# Patient Record
Sex: Female | Born: 1970 | Race: White | Hispanic: Yes | Marital: Married | Smoking: Never smoker
Health system: Southern US, Community
[De-identification: ages and names within clinical notes are randomized; demographics above are authoritative.]

## PROBLEM LIST (undated history)

## (undated) DIAGNOSIS — Z9889 Other specified postprocedural states: Secondary | ICD-10-CM

## (undated) DIAGNOSIS — N393 Stress incontinence (female) (male): Secondary | ICD-10-CM

## (undated) DIAGNOSIS — N979 Female infertility, unspecified: Secondary | ICD-10-CM

## (undated) DIAGNOSIS — Z8349 Family history of other endocrine, nutritional and metabolic diseases: Secondary | ICD-10-CM

## (undated) DIAGNOSIS — D649 Anemia, unspecified: Secondary | ICD-10-CM

## (undated) DIAGNOSIS — Z87440 Personal history of urinary (tract) infections: Secondary | ICD-10-CM

## (undated) DIAGNOSIS — B379 Candidiasis, unspecified: Secondary | ICD-10-CM

## (undated) DIAGNOSIS — R112 Nausea with vomiting, unspecified: Secondary | ICD-10-CM

## (undated) DIAGNOSIS — R102 Pelvic and perineal pain: Secondary | ICD-10-CM

## (undated) DIAGNOSIS — D219 Benign neoplasm of connective and other soft tissue, unspecified: Secondary | ICD-10-CM

## (undated) DIAGNOSIS — IMO0002 Reserved for concepts with insufficient information to code with codable children: Secondary | ICD-10-CM

## (undated) DIAGNOSIS — E119 Type 2 diabetes mellitus without complications: Secondary | ICD-10-CM

## (undated) DIAGNOSIS — N644 Mastodynia: Secondary | ICD-10-CM

## (undated) DIAGNOSIS — N938 Other specified abnormal uterine and vaginal bleeding: Secondary | ICD-10-CM

## (undated) DIAGNOSIS — E785 Hyperlipidemia, unspecified: Secondary | ICD-10-CM

## (undated) DIAGNOSIS — N809 Endometriosis, unspecified: Secondary | ICD-10-CM

## (undated) DIAGNOSIS — Z8742 Personal history of other diseases of the female genital tract: Secondary | ICD-10-CM

## (undated) HISTORY — DX: Personal history of urinary (tract) infections: Z87.440

## (undated) HISTORY — DX: Pelvic and perineal pain: R10.2

## (undated) HISTORY — DX: Anemia, unspecified: D64.9

## (undated) HISTORY — DX: Family history of other endocrine, nutritional and metabolic diseases: Z83.49

## (undated) HISTORY — DX: Reserved for concepts with insufficient information to code with codable children: IMO0002

## (undated) HISTORY — DX: Other specified postprocedural states: Z98.890

## (undated) HISTORY — DX: Type 2 diabetes mellitus without complications: E11.9

## (undated) HISTORY — PX: ROBOTIC ASSISTED LAP VAGINAL HYSTERECTOMY: SHX2362

## (undated) HISTORY — PX: WISDOM TOOTH EXTRACTION: SHX21

## (undated) HISTORY — DX: Female infertility, unspecified: N97.9

## (undated) HISTORY — DX: Other specified abnormal uterine and vaginal bleeding: N93.8

## (undated) HISTORY — DX: Mastodynia: N64.4

## (undated) HISTORY — DX: Nausea with vomiting, unspecified: R11.2

## (undated) HISTORY — DX: Personal history of other diseases of the female genital tract: Z87.42

## (undated) HISTORY — DX: Stress incontinence (female) (male): N39.3

## (undated) HISTORY — PX: DILATION AND CURETTAGE OF UTERUS: SHX78

## (undated) HISTORY — DX: Hyperlipidemia, unspecified: E78.5

## (undated) HISTORY — DX: Candidiasis, unspecified: B37.9

## (undated) HISTORY — DX: Benign neoplasm of connective and other soft tissue, unspecified: D21.9

## (undated) HISTORY — DX: Endometriosis, unspecified: N80.9

---

## 1993-07-01 HISTORY — PX: CYSTECTOMY: SUR359

## 1997-07-01 DIAGNOSIS — R87619 Unspecified abnormal cytological findings in specimens from cervix uteri: Secondary | ICD-10-CM

## 1997-07-01 DIAGNOSIS — IMO0002 Reserved for concepts with insufficient information to code with codable children: Secondary | ICD-10-CM

## 1997-07-01 HISTORY — DX: Unspecified abnormal cytological findings in specimens from cervix uteri: R87.619

## 1997-07-01 HISTORY — PX: LEEP: SHX91

## 1997-07-01 HISTORY — DX: Reserved for concepts with insufficient information to code with codable children: IMO0002

## 1998-01-18 ENCOUNTER — Encounter: Admission: RE | Admit: 1998-01-18 | Discharge: 1998-04-18 | Payer: Self-pay | Admitting: Gynecology

## 1998-03-17 ENCOUNTER — Ambulatory Visit (HOSPITAL_COMMUNITY): Admission: RE | Admit: 1998-03-17 | Discharge: 1998-03-17 | Payer: Self-pay | Admitting: Gynecology

## 1998-07-27 ENCOUNTER — Ambulatory Visit (HOSPITAL_COMMUNITY): Admission: RE | Admit: 1998-07-27 | Discharge: 1998-07-27 | Payer: Self-pay | Admitting: Gynecology

## 1999-05-07 ENCOUNTER — Encounter (INDEPENDENT_AMBULATORY_CARE_PROVIDER_SITE_OTHER): Payer: Self-pay | Admitting: Specialist

## 1999-05-07 ENCOUNTER — Other Ambulatory Visit: Admission: RE | Admit: 1999-05-07 | Discharge: 1999-05-07 | Payer: Self-pay | Admitting: Gynecology

## 1999-07-02 DIAGNOSIS — Z8742 Personal history of other diseases of the female genital tract: Secondary | ICD-10-CM

## 1999-07-02 HISTORY — DX: Personal history of other diseases of the female genital tract: Z87.42

## 2000-02-18 ENCOUNTER — Other Ambulatory Visit: Admission: RE | Admit: 2000-02-18 | Discharge: 2000-02-18 | Payer: Self-pay | Admitting: *Deleted

## 2000-05-10 ENCOUNTER — Emergency Department (HOSPITAL_COMMUNITY): Admission: EM | Admit: 2000-05-10 | Discharge: 2000-05-10 | Payer: Self-pay | Admitting: Emergency Medicine

## 2000-07-01 DIAGNOSIS — Z8742 Personal history of other diseases of the female genital tract: Secondary | ICD-10-CM

## 2000-07-01 HISTORY — DX: Personal history of other diseases of the female genital tract: Z87.42

## 2001-02-26 ENCOUNTER — Other Ambulatory Visit: Admission: RE | Admit: 2001-02-26 | Discharge: 2001-02-26 | Payer: Self-pay | Admitting: Gynecology

## 2001-07-01 DIAGNOSIS — R102 Pelvic and perineal pain: Secondary | ICD-10-CM

## 2001-07-01 DIAGNOSIS — Z8742 Personal history of other diseases of the female genital tract: Secondary | ICD-10-CM

## 2001-07-01 HISTORY — DX: Pelvic and perineal pain: R10.2

## 2001-07-01 HISTORY — DX: Personal history of other diseases of the female genital tract: Z87.42

## 2001-07-01 HISTORY — PX: HYSTEROSCOPY WITH D & C: SHX1775

## 2001-07-16 ENCOUNTER — Ambulatory Visit (HOSPITAL_COMMUNITY): Admission: RE | Admit: 2001-07-16 | Discharge: 2001-07-16 | Payer: Self-pay | Admitting: Obstetrics and Gynecology

## 2001-07-16 ENCOUNTER — Encounter: Payer: Self-pay | Admitting: Obstetrics and Gynecology

## 2001-12-14 ENCOUNTER — Other Ambulatory Visit: Admission: RE | Admit: 2001-12-14 | Discharge: 2001-12-14 | Payer: Self-pay | Admitting: Obstetrics and Gynecology

## 2001-12-22 ENCOUNTER — Encounter (INDEPENDENT_AMBULATORY_CARE_PROVIDER_SITE_OTHER): Payer: Self-pay | Admitting: Specialist

## 2001-12-22 ENCOUNTER — Ambulatory Visit (HOSPITAL_COMMUNITY): Admission: RE | Admit: 2001-12-22 | Discharge: 2001-12-22 | Payer: Self-pay | Admitting: Obstetrics and Gynecology

## 2002-07-01 DIAGNOSIS — Z8742 Personal history of other diseases of the female genital tract: Secondary | ICD-10-CM

## 2002-07-01 HISTORY — DX: Personal history of other diseases of the female genital tract: Z87.42

## 2003-06-21 ENCOUNTER — Other Ambulatory Visit: Admission: RE | Admit: 2003-06-21 | Discharge: 2003-06-21 | Payer: Self-pay | Admitting: Obstetrics and Gynecology

## 2004-06-08 ENCOUNTER — Observation Stay (HOSPITAL_COMMUNITY): Admission: AD | Admit: 2004-06-08 | Discharge: 2004-06-09 | Payer: Self-pay | Admitting: Obstetrics and Gynecology

## 2004-06-14 ENCOUNTER — Ambulatory Visit (HOSPITAL_COMMUNITY): Admission: RE | Admit: 2004-06-14 | Discharge: 2004-06-14 | Payer: Self-pay | Admitting: Obstetrics and Gynecology

## 2004-06-18 ENCOUNTER — Ambulatory Visit (HOSPITAL_COMMUNITY): Admission: RE | Admit: 2004-06-18 | Discharge: 2004-06-18 | Payer: Self-pay | Admitting: Obstetrics and Gynecology

## 2004-06-21 ENCOUNTER — Ambulatory Visit (HOSPITAL_COMMUNITY): Admission: RE | Admit: 2004-06-21 | Discharge: 2004-06-21 | Payer: Self-pay | Admitting: Obstetrics and Gynecology

## 2004-06-21 ENCOUNTER — Ambulatory Visit: Payer: Self-pay | Admitting: *Deleted

## 2004-09-19 ENCOUNTER — Encounter (INDEPENDENT_AMBULATORY_CARE_PROVIDER_SITE_OTHER): Payer: Self-pay | Admitting: *Deleted

## 2004-09-19 ENCOUNTER — Inpatient Hospital Stay (HOSPITAL_COMMUNITY): Admission: AD | Admit: 2004-09-19 | Discharge: 2004-09-22 | Payer: Self-pay | Admitting: Obstetrics and Gynecology

## 2005-02-19 ENCOUNTER — Other Ambulatory Visit: Admission: RE | Admit: 2005-02-19 | Discharge: 2005-02-19 | Payer: Self-pay | Admitting: Obstetrics and Gynecology

## 2005-07-01 DIAGNOSIS — N644 Mastodynia: Secondary | ICD-10-CM

## 2005-07-01 HISTORY — PX: NOVASURE ABLATION: SHX5394

## 2005-07-01 HISTORY — DX: Mastodynia: N64.4

## 2005-07-19 ENCOUNTER — Ambulatory Visit (HOSPITAL_COMMUNITY): Admission: RE | Admit: 2005-07-19 | Discharge: 2005-07-19 | Payer: Self-pay | Admitting: Obstetrics and Gynecology

## 2005-07-19 ENCOUNTER — Encounter (INDEPENDENT_AMBULATORY_CARE_PROVIDER_SITE_OTHER): Payer: Self-pay | Admitting: Specialist

## 2005-07-22 ENCOUNTER — Inpatient Hospital Stay (HOSPITAL_COMMUNITY): Admission: AD | Admit: 2005-07-22 | Discharge: 2005-07-25 | Payer: Self-pay | Admitting: Obstetrics and Gynecology

## 2006-03-25 ENCOUNTER — Other Ambulatory Visit: Admission: RE | Admit: 2006-03-25 | Discharge: 2006-03-25 | Payer: Self-pay | Admitting: Obstetrics and Gynecology

## 2006-06-26 IMAGING — CT CT ABDOMEN W/ CM
1 of 3 series · 13 of 32 positions shown, 18 images · IV contrast (omnipaque)
Comparison: none

CLINICAL DATA: Status post uterine ablation on 07/19/05 with abdominal pain, nausea, vomiting and elevated white count. History of fibroids and myomectomy in 7888.
ABDOMEN CT WITH CONTRAST:
TECHNIQUE: Multidetector CT imaging of the abdomen was performed following the standard protocol during bolus administration of intravenous contrast.
Contrast:  100 cc Omnipaque 300 and oral contrast.
TECHNIQUE: Multidetector CT imaging of the pelvis was performed following the standard protocol during bolus administration of intravenous contrast.

[Series 2: abd pelvis · axial · 0.69mm/px · z∈[-468,-53]mm · 13 of 94 slices shown, 18 images]
[im 6/94  soft-tissue]
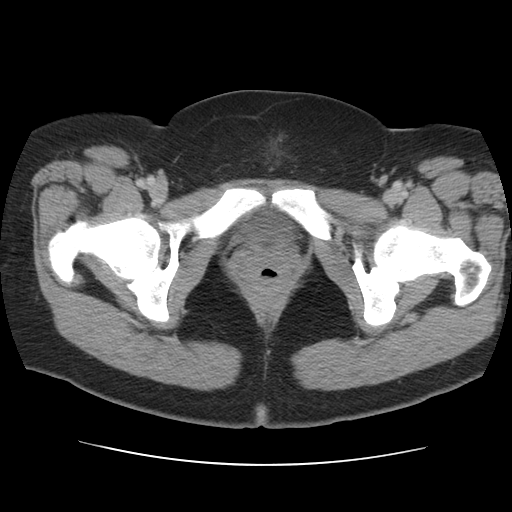
[im 6/94  bone]
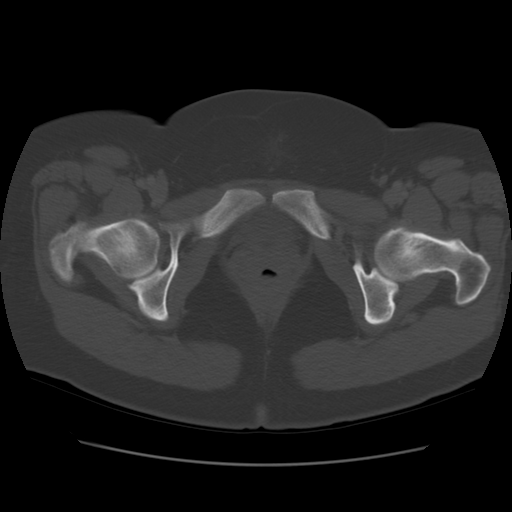
[im 17/94  soft-tissue]
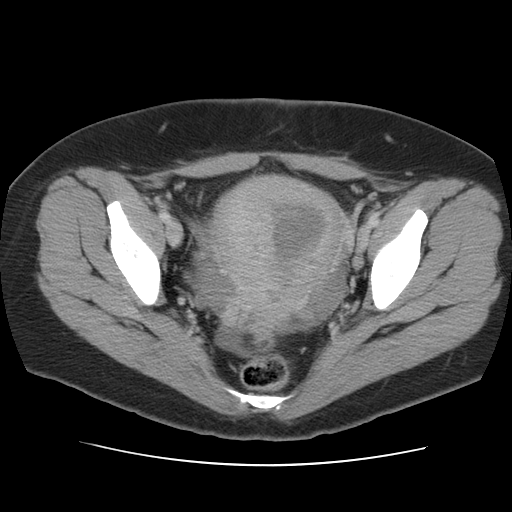
[im 22/94  soft-tissue]
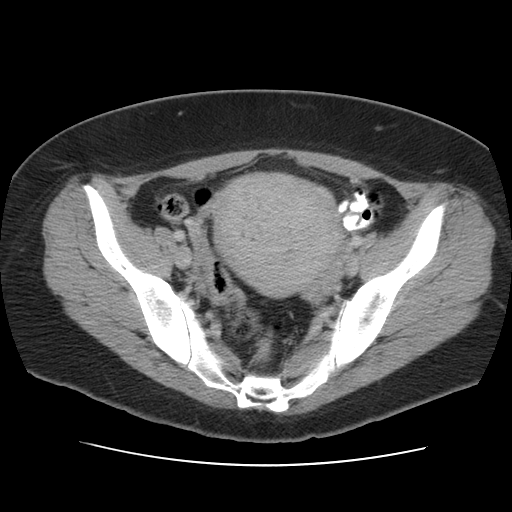
[im 28/94  soft-tissue]
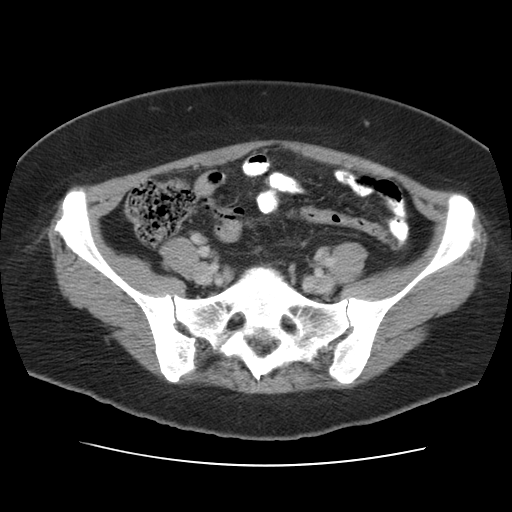
[im 39/94  soft-tissue]
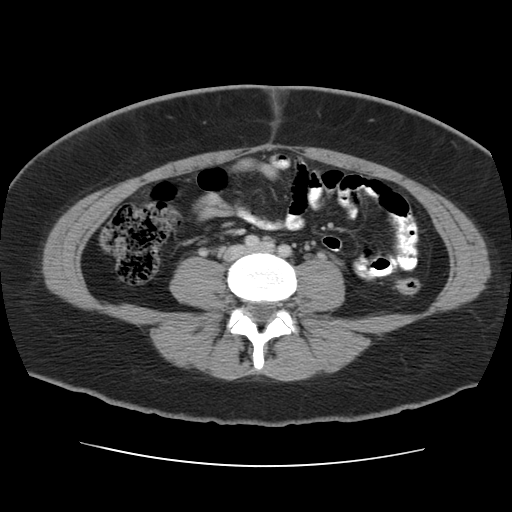
[im 44/94  soft-tissue]
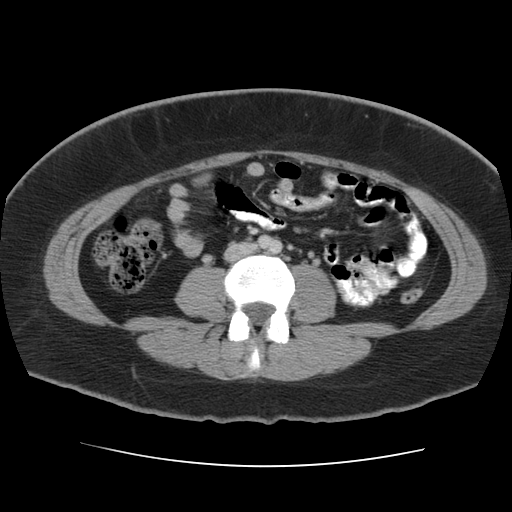
[im 50/94  soft-tissue]
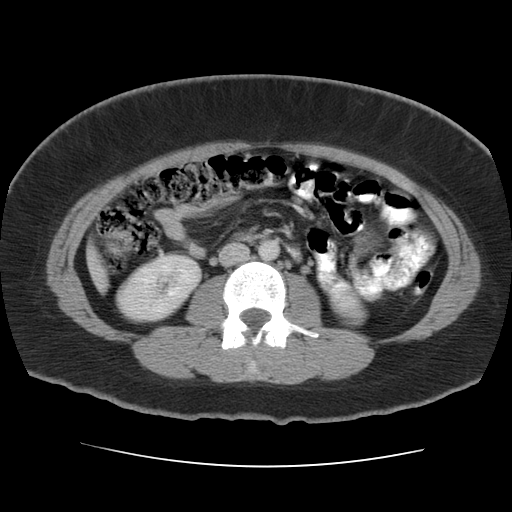
[im 61/94  soft-tissue]
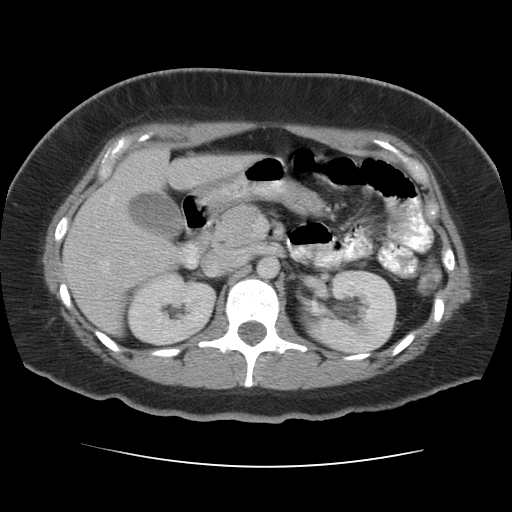
[im 66/94  soft-tissue]
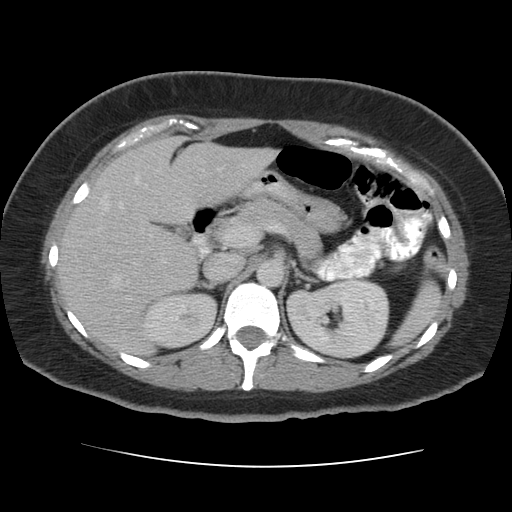
[im 66/94  bone]
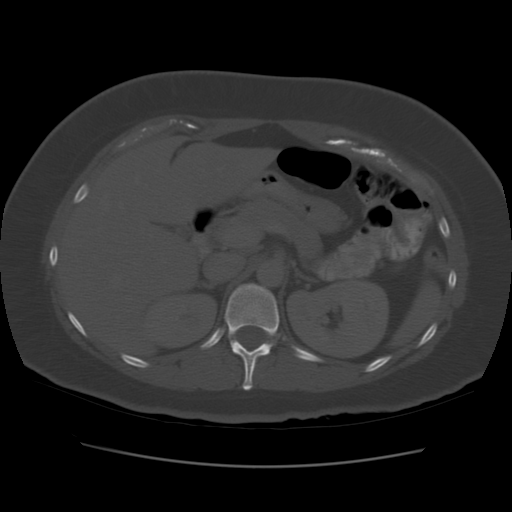
[im 72/94  soft-tissue]
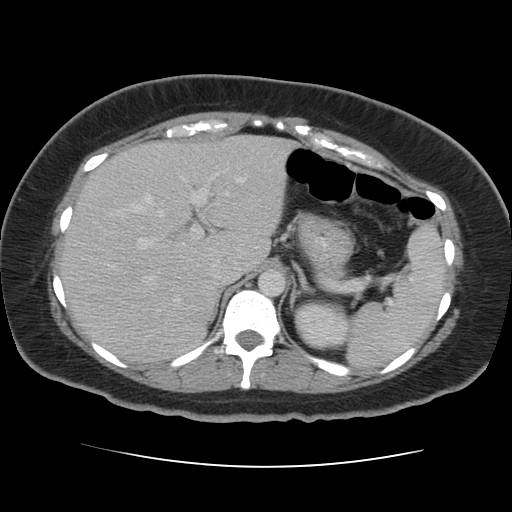
[im 72/94  lung]
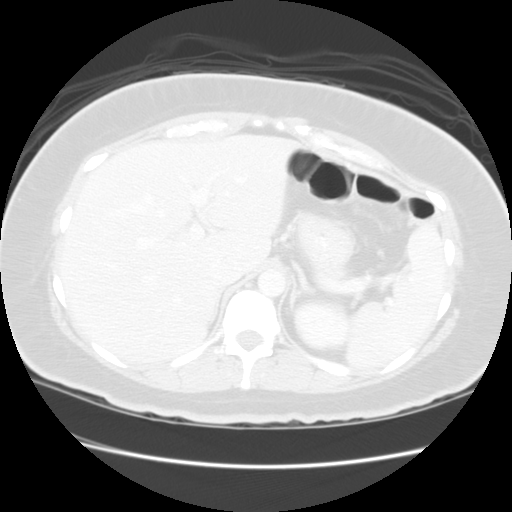
[im 77/94  lung]
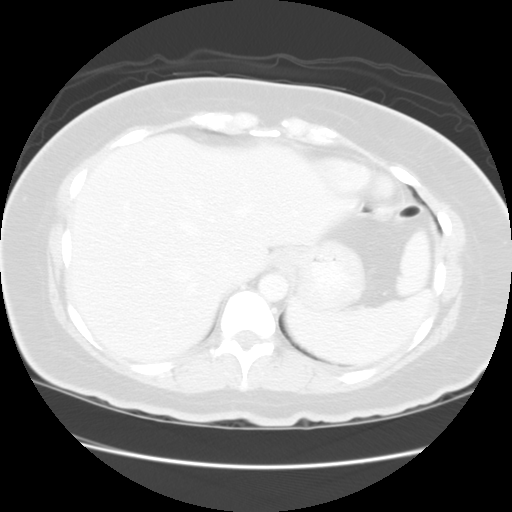
[im 83/94  soft-tissue]
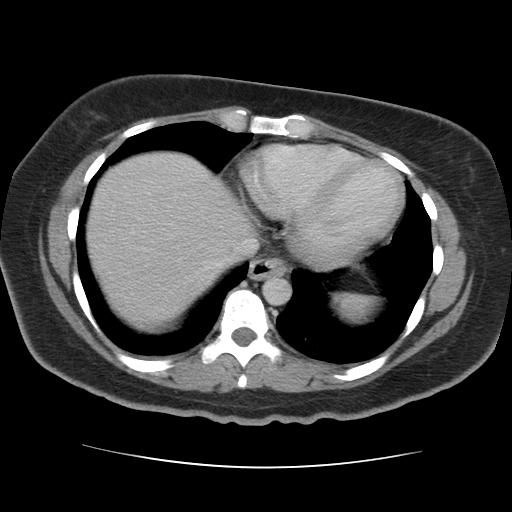
[im 83/94  lung]
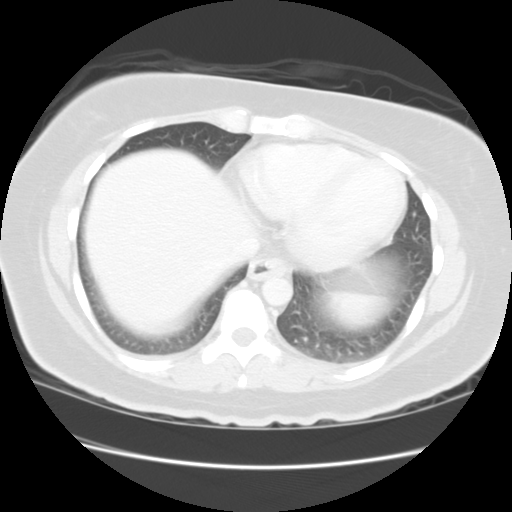
[im 88/94  soft-tissue]
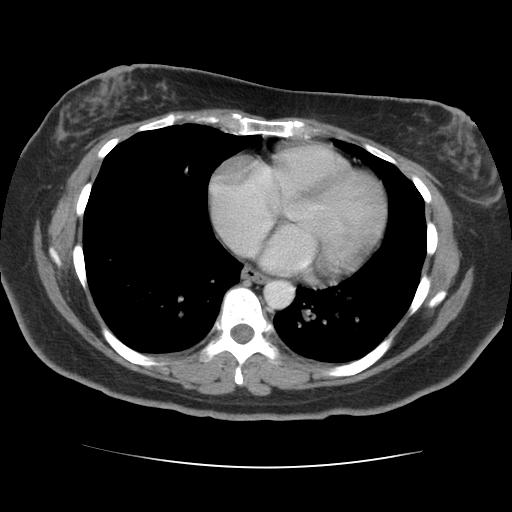
[im 88/94  lung]
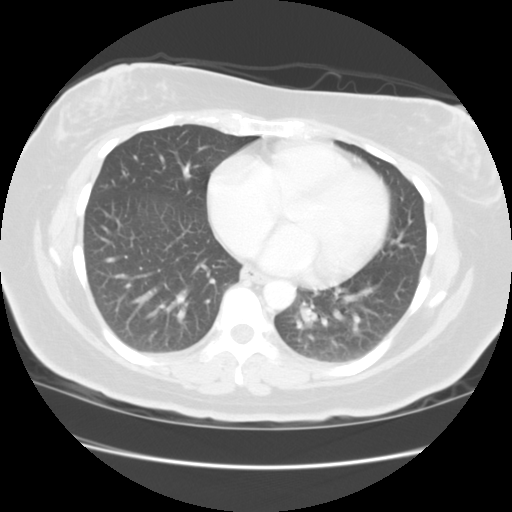

[13 of 32 positions shown; findings below may reference images not displayed]

FINDINGS: Spiral scanning from the dome of the diaphragm to the pubic symphysis was obtained.  Both soft tissue and lung windows were obtained as well as delayed images through the kidneys and bladder.
FINDINGS: The lung bases appear clear. The liver, spleen, adrenal glands, kidneys, pancreas, gallbladder, and proximal bowel all have a normal post contrast appearance.  Incidentally noted is a small splenule in the region of the splenic hilum.  No intraabdominal fluid is seen.  One small periaortic lymph node is seen and this measures less than 1 cm which is within acceptable limits for size. No signs of free intraperitoneal air noted.  Delayed images through the kidneys reveal symmetric excretion pattern. Noted is a small subcentimeter cyst in the mid pole of the right kidney.
IMPRESSION: Unremarkable abdominal CT.
PELVIS CT WITH CONTRAST:
FINDINGS: Good distal small bowel contrast was achieved.  No focal bowel abnormality is identified.  No abnormal intraabdominal collection is seen.  A small amount of fluid is noted in the cul-de-sac.  There is some heterogeneous enhancement and fluid identified within the endometrial portion of the uterus which is likely post procedural in nature given the patient?s recent history of ablation.  No signs of inflammatory change in the periuterine fat are seen and aside from a small amount of free fluid in the cul-de-sac, no periuterine fluid is noted.  A normal appearing appendix is seen. No abnormal adenopathy is seen within the pelvis.
Delayed images through the bladder demonstrate normal distal ureters and a normal bladder morphology.
IMPRESSION: 1.  Some mottling of the enhancement of the myometrium adjacent to the endometrium, as well as fluid within the endometrial canal is seen and is felt likely to be post procedural in nature.
2.  Small amount of cul-de-sac fluid.
3.  No signs of inflammatory change within the pelvis.  No abnormal focal collections and no definite bowel abnormality noted.

## 2008-01-13 ENCOUNTER — Encounter: Admission: RE | Admit: 2008-01-13 | Discharge: 2008-01-13 | Payer: Self-pay | Admitting: Obstetrics and Gynecology

## 2008-10-14 DIAGNOSIS — N938 Other specified abnormal uterine and vaginal bleeding: Secondary | ICD-10-CM

## 2008-10-14 DIAGNOSIS — N8003 Adenomyosis of the uterus: Secondary | ICD-10-CM

## 2008-10-14 DIAGNOSIS — N800 Endometriosis of uterus: Secondary | ICD-10-CM

## 2008-10-14 DIAGNOSIS — D219 Benign neoplasm of connective and other soft tissue, unspecified: Secondary | ICD-10-CM

## 2008-10-14 HISTORY — DX: Endometriosis of uterus: N80.0

## 2008-10-14 HISTORY — DX: Other specified abnormal uterine and vaginal bleeding: N93.8

## 2008-10-14 HISTORY — DX: Benign neoplasm of connective and other soft tissue, unspecified: D21.9

## 2008-10-14 HISTORY — DX: Adenomyosis of the uterus: N80.03

## 2008-10-18 ENCOUNTER — Ambulatory Visit (HOSPITAL_COMMUNITY): Admission: RE | Admit: 2008-10-18 | Discharge: 2008-10-18 | Payer: Self-pay | Admitting: Obstetrics and Gynecology

## 2008-10-18 ENCOUNTER — Encounter (INDEPENDENT_AMBULATORY_CARE_PROVIDER_SITE_OTHER): Payer: Self-pay | Admitting: Obstetrics and Gynecology

## 2010-07-21 ENCOUNTER — Encounter: Payer: Self-pay | Admitting: Obstetrics and Gynecology

## 2010-07-22 ENCOUNTER — Encounter: Payer: Self-pay | Admitting: Obstetrics and Gynecology

## 2010-10-10 LAB — BASIC METABOLIC PANEL
BUN: 6 mg/dL (ref 6–23)
Chloride: 109 mEq/L (ref 96–112)
Creatinine, Ser: 0.63 mg/dL (ref 0.4–1.2)
GFR calc non Af Amer: >60 mL/min (ref >60)
Glucose, Bld: 107 mg/dL — ABNORMAL HIGH (ref 70–99)
Potassium: 4.2 mEq/L (ref 3.5–5.1)

## 2010-10-10 LAB — CBC
HCT: 30.6 % — ABNORMAL LOW (ref 36.0–46.0)
MCV: 77.4 fL — ABNORMAL LOW (ref 78.0–100.0)
Platelets: 504 10*3/uL — ABNORMAL HIGH (ref 150–400)
RDW: 16.7 % — ABNORMAL HIGH (ref 11.5–15.5)

## 2010-10-10 LAB — PREGNANCY, URINE: Preg Test, Ur: NEGATIVE

## 2010-11-13 NOTE — H&P (Signed)
[name], [name]                  ACCOUNT NO.:  [id number]   MEDICAL RECORD NO.:  [id number]          PATIENT TYPE:  AMB   LOCATION:  DAY                          FACILITY:  [facility]   PHYSICIAN:  Crist Fat. Rivard, M.D. DATE OF BIRTH:  1970/09/19   DATE OF ADMISSION:  10/18/2008  DATE OF DISCHARGE:                              HISTORY & PHYSICAL   HISTORY OF PRESENT ILLNESS:  [name] is a 40 year old married Hispanic  female, para 1-0-0-1, who presents for a robotically-assisted  hysterectomy because of dysfunctional uterine bleeding, adenomyosis,  endometriosis, and uterine fibroids.  Ms. Gasparyan has a long history of  heavy irregular vaginal bleeding and was early on diagnosed with  polycystic ovarian syndrome and endometriosis.  In 2003 while being  evaluated for chronic pelvic pain and heavy bleeding, the patient was  diagnosed with a submucosal fibroid which initially was treated with  Lupron 11.25 mg, which provided her symptomatic relief until she  underwent a hysteroscopy with D and C in June of that year.  Following  this procedure the patient had good control of her symptoms using oral  contraceptives for almost a year, however after that time she began to  bleed heavily again accompanied by severe pain.  In 2007 the patient  underwent endometrial ablation for her persistent heavy bleeding, but in  spite of this procedure she resumed bleeding heavily.  The patient was  placed on oral contraception followed by the Implanon implantable  device, however, neither of these efforts curtailed her irregular and  heavy bleeding.  Again, in July 2008 the patient was given Lupron, which  rendered her amenorrheic as well as pain free for almost a year.  She  again approximately 1 year later began her cycle of heavy bleeding and  pelvic pain.  In spite of being given Depo-Provera, her symptoms  persisted, though she did report that her bleeding was lessened  significantly.  An endometrial  biopsy done July 2009 showed no  hyperplasia, atypia or malignancy, and her TSH was normal.  At that same  time the patient underwent an MRI of her pelvis which showed that the  uterus contained findings consistent with adenomyosis.  Since November  2009, the patient reports daily bleeding which has been intermittent  with flow increasing to the point at times when she has to __________  due to clotting every hour.  On other days, however she states she can  go up to 2 hours without having to change her pad.  She reports severe  cramping which she rates as a 10/10 on a 10-point pain scale, but will  have a decrease in her symptoms with Naprosyn (5/10), and almost totally  eradicated using Vicodin (2/10).  Her complete blood count in February  2010 showed hemoglobin of 13.0, hematocrit 39.2.  She was placed on  Provera 10 mg which she takes twice daily and as a result only has to  use 4 panty liners per day.  The patient continues to have cramping,  however it is manageable using the previously mentioned medications.  The patient  admits to nausea and vomiting when she has severe pain and  will leak small amounts of urine if she were to cough, sneeze or laugh.  Otherwise, she denies any dysuria, any urinary frequency, any urgency,  changes in her bowel movements, vaginitis symptoms or fever.  Given the  protracted and disruptive nature of her symptoms, though the patient is  fully aware of both medical and surgical management options, she has  decided to proceed with definitive therapy in the form of hysterectomy.   The patient underwent a pelvic ultrasound on October 13, 2008 which showed  a uterus measuring 10.59 cm x 9.57 cm x 7.37 cm.  She had a posterior  fibroid measuring 3.43 x 2.49 x 3.03 cm and an anterior fibroid  measuring 2.31 x 1.78 x 2.02 cm.  Her left ovary measured 3.49 x 3.52 x  1.96 cm, and her right ovary measures 3.10 x 3.44 x 2.83 cm.  The  remainder of the patient's  study was within normal limits.   PAST MEDICAL HISTORY:   OBSTETRICAL HISTORY:  Gravida 1, para 1-0-0-1.  The patient underwent  cesarean section as a result of in vitro fertilization utilizing donor  sperm.   GYNECOLOGIC HISTORY:  Menarche at 40 years old.  Last menstrual period:  See history of present illness.  The patient does not use any method of  contraception.  She denies any history of sexually transmitted diseases.  She underwent a LEEP procedure in 1999.  Pap smears have been normal  since that time however, with her most recent being November 2009.   MEDICAL HISTORY:  1. Endometriosis.  2. Polycystic ovarian syndrome.  3. Anemia (hemoglobin as low as 7.9).   SURGICAL HISTORY:  1. 1995 laparotomy for a right ovarian cystectomy.  2. Between 1999 and 2001 the patient underwent 3 D and C's for heavy      irregular bleeding.  3. 2000 diagnostic laparoscopy, at which time she was diagnosed with      endometriosis.  She also had lysis of adhesions.  4. 2003 hysteroscopy, D and C, with resection of a submucosal fibroid.  5. 2007 endometrial ablation.  She denies any history of blood      transfusion.  She does admit to severe nausea following anesthesia.   FAMILY HISTORY:  Thyroid disease, diabetes, hypertension.   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  Habits:  She does not use tobacco or illicit drugs, but she rarely  consumes alcohol.   CURRENT MEDICATIONS:  Provera 10 mg 2 tablets daily.  The patient has no  known drug allergies.   REVIEW OF SYSTEMS:  The patient does wear glasses.  She does have stress  urinary incontinence symptoms.  She denies any chest pain, shortness of  breath, headache, vision changes, nausea or vomiting, diarrhea, joint  pain, skin rashes and except as is mentioned in history of present  illness, the patient's review of systems is otherwise negative.   PHYSICAL EXAMINATION:  Blood pressure 120/78, pulse is  82, respirations  16, weight 189, height 5 feet 6-1/2 inches tall.  Body mass index 30.1.  GENERAL EXAM:  Neck is supple without masses.  There is no thyromegaly  or cervical adenopathy.  HEART:  Regular rate and rhythm.  LUNGS:  Clear.  BACK:  No CVA tenderness.  ABDOMEN:  No tenderness, masses, organomegaly.  EXTREMITIES:  No clubbing, cyanosis or edema.  PELVIC EXAM:  EG:  BUS is  within normal limits.  Vagina is rugose.  Cervix is nontender without lesions.  Uterus appears 10-12 weeks' size  and is mobile.  There is no palpable tenderness.  Adnexa:  No tenderness  or masses.  Rectovaginal:  No masses.   IMPRESSION:  1. Dysfunctional uterine bleeding.  2. Adenomyosis.  3. Uterine fibroids.  4. Endometriosis.   DISPOSITION:  A discussion was held with the patient regarding the  indications for her procedures along with its risks which include but  are not limited to reaction to anesthesia, damage to adjacent organs,  infection, excessive bleeding, transient postoperative facial edema,  that an open abdominal incision may be necessary to complete her  surgery, and that her pain may not totally be alleviated by this  procedure.  The patient was further advised that her hospital stay is  expected to be 1-2 days, that she would be expected to return to her  normal activities in 2-3 weeks, and that the robotic procedure does  require more time than an open abdominal approach.  The patient  verbalized understanding of these risks and has consented to proceed  with a robotically-assisted hysterectomy at [facility] on October 18, 2008 at 1:30.      Elmira J. Adline Peals.      Crist Fat Rivard, M.D.  Electronically Signed    EJP/MEDQ  D:  10/14/2008  T:  10/14/2008  Job:  [id number]

## 2010-11-13 NOTE — Op Note (Signed)
NAMEGILBERTE, GORLEY                  ACCOUNT NO.:  [id number]   MEDICAL RECORD NO.:  [id number]          PATIENT TYPE:  AMB   LOCATION:  DAY                          [facility]ACILITY[facility]  [facility]   PHYSICIAN:  Crist Fat. Rivard, M.D. DATE OF BIRTH:  1971/03/14   DATE OF PROCEDURE:  10/18/2008  DATE OF DISCHARGE:                               OPERATIVE REPORT   PREOPERATIVE DIAGNOSIS:  Dysfunctional uterine bleeding, adenomyosis  uterine fibroids, pelvic endometriosis.   POSTOPERATIVE DIAGNOSIS:  Dysfunctional uterine bleeding, adenomyosis  uterine fibroids, pelvic endometriosis with pelvic adhesions.   ANESTHESIA:  General, Dr. Jean Rosenthal.   PROCEDURE:  Laparoscopy total hysterectomy with robotic assistance.   SURGEON:  Dr. Estanislado Pandy.   ASSISTANT:  Elmira J. Lowell Guitar, P.A.   ESTIMATED BLOOD LOSS:  100 mL.   PROCEDURE:  After being informed of the planned procedure with possible  complications including bleeding, infection, injury to bowel, bladder or  ureters, need for laparotomy, expected hospital stay, expected recovery  as well as transient postoperative fascial edema, informed consent is  obtained.   The patient is taken to OR #10 and given general anesthesia without any  complication with endotracheal intubation.  She is placed in the  lithotomy position on a sticky mattress, arms padded and tucked on each  side with knee-high sequential compressive devices and chest taped to  the table.  She is prepped and draped in a sterile fashion.  Pelvic exam  reveals an anteverted uterus approximately 16 weeks in size and mobile.  Adnexa feel normal.  A weighted speculum is inserted.  Anterior lip of  the cervix is grasped with a tenaculum Forceps and the cervix is easily  dilated using Hegar dilator until #25.  The uterus is then sounded at 12  cm which allows easy placement of a #10 Rumi intrauterine manipulator  with the 3.0 Koh ring and a vaginal occluder.  The Rumi balloon is  inflated with 5 mL  of saline, and the Koh ring is sutured to the cervix  with 0 Vicryl.  Retractors are removed.   We measure 12 cm above the fundus of the uterus which is about 4 cm  above the umbilicus and infiltrate that area with Marcaine 0.25, perform  a 10-mm vertical incision which is brought down bluntly to the fascia.  The fascia is identified, grasped with Kocher forceps, incised with Mayo  scissors.  Peritoneum is entered bluntly.  A 0 Vicryl pursestring suture  is placed on the fascia and a 10-mm Hassan trocar is easily inserted in  the abdominal cavity, held in place with the previously placed  pursestring suture.   This allows for easy insufflation of the pneumoperitoneum with CO2 at a  maximum pressure of 15 mmHg.  The camera is inserted in the cavity to  have a first look at the abdominal pelvic cavity.   Observation:  There are adhesions between the omentum and the anterior  wall of the abdomen which will need to be taken care of in order to  proceed with our surgery.  Going on the left side, we are  able to see a  bulky, round uterus.  Anterior cul-de-sac appears normal initially.  Posterior cul-de-sac appears normal.  Left tube is normal.  Left ovary  is normal.  Right tube is normal.  Right ovary is adherent to the right  pelvic wall.  Appendix is visualized and normal, and liver is partially  visualized and appears normal.  Gallbladder is not visualized.   We decide on our trocar placement and insert two 8-mm robotic trocars on  the left under direct visualization after infiltrating with Marcaine  0.25 and one 8-mm robotic trocar on the right as well as a 10-mm patient-  side assistant trocar on the right after infiltrating with Marcaine 0.25  under direct visualization.  The robot is docked on the left side of the  patient, and complete preparation and docking is completed in 42  minutes. A Monopolar scissor is placed in arm #1, a PK Gyrus forceps in  arm #2 and a Cobra forceps in  arm #3.   Console time starts at 1:04, and we proceed with systematic lysis of  adhesions between the omentum and the anterior wall of the abdomen using  bipolar cauterization and monopolar section.  This is achieved without  any complication.  This gives [place] a very good view of the pelvis.  We are  able to mobilize well with the Rumi, and we decide to start with the  left side.  The left tube is cauterized and sectioned.  Left utero-  ovarian ligament cauterized and sectioned.  Left round ligament  cauterized and sectioned.  This gives [place] entry into the broad ligament,  and we dissect the anterior sheath of the broad ligament bluntly and  sharply all the way to above the [facility] ring.  The Koh ring is easily  identified with pressure applied by our vaginal assistant.  We are able  to dissect the bladder bluntly and sharply, but there are a few  adhesions there, and we stop our dissection at this time.  The posterior  sheath of the broad ligament is brought down sharply to skeletonize the  uterine artery at the level of the [facility] ring, keeping the left ureter  under direct visualization.  We now move to the right and proceed with  cauterization of the right tube and section, cauterization of the utero-  ovarian ligament and section, cauterization of the round ligament and  section.  The anterior sheath of the broad ligament is then opened  sharply, and we can dissect the retroperitoneal space, and we can move  the bladder below the [facility] ring.  Because of these adhesions due to C-  section, there remains a question whether the bladder is completely  dissected away, and we proceed with filling the bladder with sterile  infant formula to see its placement.  With this, we are able to easily  identify the adhesions that are dissected sharply in order to completely  free the vaginal anterior vaginal fornix.  Moving to the right side, the  ovary is still adherent to the left pelvic wall and also  retracts the  right ureter.  We pull on the ovary to try to find a plane of  dissection, but unfortunately that adhesion is dense, grade 3, and is  right above the ureter.  In order to skeletonize our uterine artery, we  need to section that adhesion, and decision is made to resect a little  piece of the ovary and leave it on the pelvic wall, and  this is done  with monopolar scissors.  The ovary is then freed completely, and the  peritoneum is not entered, leaving the right ureter intact.  Now, having  the right ureter under direct visualization, we are able to bring down  the posterior sheath of the broad ligament in order to skeletonize the  uterine artery.  With good pressure applied on the Koh ring, we are now  able to cauterize the uterine artery and uterine vein at the [facility] ring  level in its ascending branch using the bipolar cauterization on both  sides.   The vaginal occluder is inflated, and we proceed with an anterior  colpotomy using an open monopolar scissors guiding ourselves with the  Koh ring which is very easily identified.  We perform a 360-degree  colpotomy, freeing the uterus entirely.  The uterus is then delivered  vaginally with some morcellation done vaginally in order to deliver it.  The vaginal occluder is reinserted in the vagina, and we return to  console time.  A suture cut is placed in arm #1, a needle holder in arm  #2and a PK forceps in arm #3.   The vagina is closed using figure-of-eight stitches of 0 Vicryl.  We  then irrigate profusely and notice satisfactory hemostasis, both with an  underwater view and after the pneumoperitoneum has been suctioned away.  A sheet of Interceed is separated in two and deposited on the vaginal  vault including the adnexal fossa on both sides.   Both ureters are again visualized and appear to be normal, nondilated  with normal peristalsis.   Console time is completed in 2 hours and 11 minutes.   The robot is undocked.   All trocars are removed under direct  visualization after evacuating the pneumoperitoneum.  The fascia of the  supraumbilical incision is closed with the previously placed pursestring  suture of 0 Vicryl.  The fascia of the right-sided 10-mm trocar is  impossible to close due to the depth of the incision despite multiple  attempts.  The skin of all 5 incisions is closed with subcuticular  suture of 3-0 Monocryl and Dermabond.   Vaginal evaluation reveals a small periurethral laceration which is  closed with a figure-of-eight stitch of 3-0 Monocryl, a small mid vagina  laceration due to our morcellation which is repaired with a figure-of-  eight stitch of 3-0 Monocryl and a small laceration of the vestibule  which is sutured with a running lock suture of 3-0 Monocryl.  Hemostasis  is then deemed adequate.  The vaginal vault is evaluated, and the suture  line is normal with good hemostasis.  Estrace cream is applied to the  vulva for relief.   Instrument and sponge count is complete x2.  Estimated blood loss is 100  mL.  The procedure is very well tolerated by the patient who is taken to  recovery room in a well and stable condition, probably discharge home  today.   SPECIMEN:  Uterus weighed 322 g and is sent to pathology.      Crist Fat Rivard, M.D.  Electronically Signed     SAR/MEDQ  D:  10/18/2008  T:  10/19/2008  Job:  [id number]

## 2010-11-16 NOTE — H&P (Signed)
NAMEMAIKA, MCELVEEN                  ACCOUNT NO.:  [id number]   MEDICAL RECORD NO.:  [id number]          PATIENT TYPE:  MAT   LOCATION:  MATC                          [facility]  [facility]   PHYSICIAN:  Naima A. Dillard, M.D. DATE OF BIRTH:  10-09-1970   DATE OF ADMISSION:  09/19/2004  DATE OF DISCHARGE:                                HISTORY & PHYSICAL   [name] is a 40 year old gravida 1, para 0 at 21 3/7 weeks, EDD September 16, 2004.  She presents from the office of [facility] following fetal heart rate  monitoring, which showed a fetal heart rate deceleration down to the 70s,  for approximately two minutes with return to baseline. Initially on  monitoring at [facility], the fetal heart rate remained 130s with  average long term variability, although the variability has been decreased.  Accelerations were noted with some mild variables.  However, one prolonged  deceleration was noted x60-90 seconds down to the 80s with return to  baseline.  An IV was started for hydration and oxygen 10L by face mask was  applied.  No contractions are showing up on tocodynamometer.  In light of  the fetal heart rate decelerations, the patient is admitted for IV hydration  and observation, induction of labor if fetal heart rate stabilizes.  Her  pregnancy has been followed by the M.D. service at [facility] and is remarkable  for:  1) IVF pregnancy with donor sperm.  2) PCOS.  3) Fibroids.  4) Status  post LEEP procedure.  5. She is group B Strep negative.  This patient  initially began prenatal care at the office of [facility] on 03/28/04 at [redacted] weeks  gestation, [facility] determined by her IVF conception.  Her pregnancy has been  complicated with a GI virus at 26 weeks.  She had decreased fetal heart rate  at that time and had ultrasound with Doppler studies and BPP done.  She had  those done twice weekly and for the most part, these remained reassuring.  At 28 weeks, her one hour glucose challenge was elevated and she did have  a  normal three hour GTT.  However, she has continued to have glycosuria.  Her  fasting blood sugar at latest check this week was 100.  She has continued  antenatal testing, which has remained reassuring to this point.  She has  been normotensive throughout her pregnancy with no proteinuria.   Prenatal lab work found hemoglobin and hematocrit 11.8 and 35.8, platelets  333,000.  Blood type and Rh O positive.  Antibody screen negative.  Sickle  cell trait negative.  VDRL nonreactive.  Rubella immune.  Hepatitis B  surface antigen negative.  Pap smear within normal limits.  GC and Chlamydia  negative.  CF testing negative at 36 weeks.  Culture of the vaginal tract is  negative.   Medical history is significant for abnormal Pap smear in 1999 and a  colposcopy with LEEP procedure.  She has a history of fibroids and ovarian  cysts.  She had surgery to remove a cyst on her right ovary  and scar tissue  was removed.  She has had a history of infertility and PCOS.  She took  Clomid in 1998 and in this past year, in 2005, was seen by Dr. Lorenso Courier for  her infertility and went through IVF with [facility] and conceived with donor  sperm.   FAMILY HISTORY:  The patient's father with a history of chronic  hypertension.  Mother, brother and sister with history of diabetes.  The  patient's mother with a history of thyroid disease.   GENETIC HISTORY:  There is no genetic history in the patient's family and  none in the sperm donor's biography.  No birth defects are listed.  There  are no known genetic or familial disorders.   The patient has no known drug allergies and she denies the use of tobacco,  alcohol or illicit drugs.   SOCIAL HISTORY:  Ms. Zazueta is a married, Hispanic female.  Her husband,  Amyrie Illingworth is involved and supportive.  They are [employer] in their  faith.   REVIEW OF SYSTEMS:  Described above.  The patient is at term with fetal  heart rate decelerations and decreased variability.   She is therefore for  admission.   PHYSICAL EXAMINATION:  VITAL SIGNS:  Stable.  She is afebrile.  HEENT:  Unremarkable.  HEART:  Regular rate and rhythm.  LUNGS:  Clear bilaterally to auscultation.  ABDOMEN:  Gravid in it's contour.  Uterine fundus is noted to extend 39 cm  above the level of the pubic symphysis.  Leopold maneuver finds the infant  to be in a longitudinal lie, cephalic presentation and the estimated fetal  weight is 8 pounds.  The baseline fetal heart rate monitor is 130s with  average long term variability, which is decreased at times, accelerations  are noted with occasional mild variables and one prolonged deceleration,  lasting 60-90 seconds, down to the 80s with return of baseline.  She is  having no contractions on tocodynamometer.  Her cervix is long and closed  per exam at [facility].  EXTREMITIES:  No edema.  DTRs are 1+ with no clonus.   ASSESSMENT:  1.  Intrauterine pregnancy at term.  2.  Fetal heart rate decelerations with decreased variability.   PLAN:  1.  Admit.  2.  IV hydration.  3.  Observation.  4.  Induction of labor if fetal heart rate stabilizes.      SDM/MEDQ  D:  09/19/2004  T:  09/19/2004  Job:  [id number]

## 2010-11-16 NOTE — Consult Note (Signed)
[name], KRETZ                  ACCOUNT NO.:  [id number]   MEDICAL RECORD NO.:  [id number]          PATIENT TYPE:  OUT   LOCATION:  ULT                           [facility]   PHYSICIAN:  Hal Morales, M.D.DATE OF BIRTH:  07/21/1970   DATE OF CONSULTATION:  06/21/2004  DATE OF DISCHARGE:                                   CONSULTATION   REASON FOR CONSULTATION:  1.  Intrauterine pregnancy at 77 weeks' gestation.  2.  History of nonreassuring fetal heart rate tracing.  3.  Status post viral infection.  4.  History of preterm contractions, resolved.    The patient was evaluated with an ultrasound biophysical profile showing 8/8  and normal amniotic fluid.  The patient also underwent uterine artery  Dopplers, which were within the normal range.  She likewise had a nonstress  test with a reactive fetal heart rate tracing and no significant fetal heart  rate decelerations.  This represented an improvement over the fetal heart  rate tracing which had been noted during her hospitalization on December 9  and 10.  She is thus deemed to have returned to essentially normal  antepartum status and will not require subsequent fetal heart rate tracings  for this particular indication.     Colin Ina   VPH/MEDQ  D:  06/21/2004  T:  06/22/2004  Job:  [id number]

## 2010-11-16 NOTE — Discharge Summary (Signed)
NAMEJENI, [name]                  ACCOUNT NO.:  [id number]   MEDICAL RECORD NO.:  [id number]          PATIENT TYPE:  INP   LOCATION:  9144                          [facility]  [facility]   PHYSICIAN:  Naima A. Dillard, M.D. DATE OF BIRTH:  December 28, 1970   DATE OF ADMISSION:  09/19/2004  DATE OF DISCHARGE:  09/22/2004                                 DISCHARGE SUMMARY   ADMISSION DIAGNOSES:  1.  Intrauterine pregnancy at term.  2.  Fetal heart rate decelerations with decreased variability.   DISCHARGE DIAGNOSES:  1.  Intrauterine pregnancy at term.  2.  Fetal heart rate decelerations with decreased variability.  3.  Nonreassuring fetal heart rate pattern.  4.  Breech presentation.  5.  Nuchal cord x1.  6.  Fibroid uterus.  7.  Status post primary low transverse cesarean section of a female infant      named Sophia, Apgars 8 and 9, weighing 5 pounds 11 ounces.   PROCEDURE:  1.  Primary low transverse cesarean section.  2.  Spinal anesthesia.   HOSPITAL COURSE:  The patient was admitted for fetal heart rate  decelerations which were seen in the office.  These persisted upon admission  to the hospital and decision was made to proceed with primary cesarean  section which was performed under spinal anesthesia with no complications.  The baby was found to be in footling breech presentation with a nuchal cord  x1 and had Apgars of 8 and 9 and did well.  On 3 days' recovery in the  postoperative period, the patient did well.  She progressed to regular diet,  ambulated without problems, voided appropriately.  Hemoglobin was 9.8.  On  day of discharge, postoperative day #3, she was stable vital signs, chest  was clear, heart rate regular rate and rhythm, abdomen was soft and  appropriately tender.  Incision was clean, dry, and intact.  JP drainage  which had been 60 mL on postoperative day #2 was decreased to 10 mL on  postoperative day #3.  Lochia was small.  Extremities were within normal  limits and she was deemed to have received the full benefit of her hospital  stay and was discharged home.   DISCHARGE MEDICATIONS:  1.  Motrin 600 mg p.o. q.6h p.r.n.  2.  Tylox one to two p.o. q.4h p.r.n.   DISCHARGE LABORATORY DATA:  White blood cell count 13, hemoglobin 9.8,  platelets 260.   DISCHARGE INSTRUCTIONS:  Per [facility] handout.   FOLLOW UP:  In 6 weeks or p.r.n.      MLW/MEDQ  D:  09/22/2004  T:  09/24/2004  Job:  [id number]

## 2010-11-16 NOTE — Discharge Summary (Signed)
[name], [name]                  ACCOUNT NO.:  [id number]   MEDICAL RECORD NO.:  [id number]          PATIENT TYPE:  INP   LOCATION:  9161                          [facility]   PHYSICIAN:  [name], M.D.DATE OF BIRTH:  1971-02-23   DATE OF ADMISSION:  06/08/2004  DATE OF DISCHARGE:  06/09/2004                                 DISCHARGE SUMMARY   ADMISSION DIAGNOSES:  1.  Intrauterine pregnancy at 25-3/7 weeks.  2.  Viral syndrome.  3.  Fetal heart rate decelerations.  4.  Preterm contractions.   DISCHARGE DIAGNOSES:  1.  Intrauterine pregnancy at 25-4/7 weeks.  2.  Resolved gastrointestinal virus.  3.  Fetal heart rate decelerations less frequency with normal Dopplers and      growth.  4.  Preterm contractions resolved.   HOSPITAL COURSE:  [name] is a 40 year old primigravida at 25-3/7 weeks,  who presented from the office secondary to onset nausea, vomiting, and  diarrhea yesterday a.m.  Her husband had similar symptoms earlier this week.  She had 1+ ketones on a urine specimen given at the office.  Her pregnancy  has been followed by the [facility] OB[facility]GYN MD service and been  remarkable for (1) History of LEEP.  (2) Conception via IVF with donor  sperm.  (3) PCOS.  (4) Fibroids.  Her vital signs were stable upon  admission.  She was afebrile.  While in maternity admissions, her fetal  heart rate had sporadic moderate-to-mild variables with mild uterine  contractions every 3 minutes.  Fetal fibronectin was completed as well as  gonorrhea and Chlamydia and group B strep.  Her cervix was closed and long.  She was given IV fluids with Phenergan.  She had an ultrasound for growth,  fluid, and Dopplers, and ultrasound showed estimated fetal weight in the  25th to 50th percentile, cervix 3.3 cm with normal fluid.  UA Doppler was  2.18 which was within normal limits.  Fetal fibronectin was negative.  After  receiving IV fluid, her virus improved.  She did not  have any further  nausea, vomiting, or diarrhea.  Preterm contractions resolved after she was  hydrated with IV fluid.  She was admitted and stayed overnight in the  antenatal unit.  She continued to have sporadic decelerations which resolved  spontaneously.  They were variable in nature and the morning of June 09, 2004, she was feeling much better, no cramping, nausea, vomiting, or  diarrhea.  She had sporadic variables during the night.  Clean catch  urinalysis was significant for 500 of glucose.  She was given betamethasone  2 doses 12 hours apart.  The patient had a second ultrasound December 10  that showed normal UA Dopplers.  Two hour postprandial blood sugar was 142  and that was also following a second betamethasone dose.  The patient was  deemed to have received the full benefit of her hospital stay, and she was  discharged home.   DISCHARGE INSTRUCTIONS:  1.  She is to go home on bed rest level 1.  2.  is  to return to work June 18, 2004, at the earliest.  3.  She is to follow up with Dopplers at [facility] the week of      June 11, 2004.  4.  She is to begin twice a week BPPs at 26 weeks, and preterm labor      precautions have been reviewed with the patient.   DISCHARGE MEDICATIONS:  Prenatal vitamin 1 p.o. daily.   Discharge follow up will occur with her ultrasound at [facility] the  week of June 11, 2004, or p.r.n.     Kimb   KS/MEDQ  D:  06/09/2004  T:  06/10/2004  Job:  [id number]

## 2010-11-16 NOTE — H&P (Signed)
NAMECHENEL, Kiara                  ACCOUNT NO.:  [id number]   MEDICAL RECORD NO.:  [id number]          PATIENT TYPE:  INP   LOCATION:  9161                          [facility]   PHYSICIAN:  Hal Morales, M.D.DATE OF BIRTH:  12-19-1970   DATE OF ADMISSION:  06/08/2004  DATE OF DISCHARGE:                                HISTORY & PHYSICAL   HISTORY OF PRESENT ILLNESS:  [name] is a 40 year old, gravida 1, para 0  at 25-3/7th weeks, who presented from the office secondary to onset of  nausea, vomiting, and diarrhea this morning.  Husband has had the same  symptoms.  The patient reports upper and lower abdominal cramping today.  She denies vaginal bleeding.  Reports positive fetal movement.  She denies  recent intercourse.  She had 1+ ketones in the office.   Pregnancy has been remarkable for history of LEEP procedure, conception with  in vitro fertilization with donor sperm, PCOS, fibroids.   PRENATAL LABORATORY DATA:  Blood type is O positive.  Rh antibody negative.  VDRL nonreactive.  Rubella titer positive.  Hepatitis B surface-antigen  negative.  RPR was nonreactive.  HIV was declined.  Cystic fibrosis testing  was negative.  GC and Chlamydia cultures were negative in the first  trimester.  Pap was normal in December 2004.  Hemoglobin upon entering the  practice was 11.8.  EDC of September 18, 2004 was established by 11 week  ultrasound and was in agreement with ultrasound at approximately 18 weeks.   PREGNANCY HISTORY:  The patient entered care at approximately 15 weeks.  Her  pregnancy was a result of in vitro fertilization secondary to infertility.  She did have some glycosuria at her first visit following sugar intake.  She  had a fasting blood sugar as follow up which was normal.  Urine was sent for  C&S secondary to hematuria.  This was negative.  She had an ultrasound at 18  weeks showing normal growth and development.  Cervix was 4.8 cm.  Face and  aortic arch were  not seen.  The patient declined quadruple screen.  EDC was  altered from March 24 to September 16, 2004.  At that time, CBG was 92.  Her  Glucola was to be scheduled.  She had a follow-up ultrasound at 20 weeks  showing normal anatomy.  The rest of her pregnancy had essentially been  uncomplicated until she had onset of a nausea, vomiting, and diarrhea this  morning.   OBSTETRICAL HISTORY:  The patient is a primigravida.   MEDICATIONS:  She was using Yasmin until May of 2005.   PAST MEDICAL HISTORY:  She had an abnormal Pap smear in 1999 with Dr.  Fontaine No practice.  She had a colposcopy and a LEEP procedure.  Since  then, Paps have been normal.  She has a history of fibroids.  She had an  exploratory laparotomy for fibroids, ovarian cyst.  Scar tissue was removed  and a cyst on her right ovary was noted.  She took Clomid in 1998.  In 2005,  went to Dr.  Leatha Gilding. Mezer and Dr. Andi Devon.  She conceived through in  vitro fertilization with a sperm donor secondary to husband having low sperm  count.  She reports the usual childhood illnesses.  She has occasional yeast  infections.   PAST SURGICAL HISTORY:  1.  LEEP procedure.  2.  Exploratory laparotomy with removal of cyst and scar tissue.  3.  Wisdom teeth removed.  She said she had nausea and vomiting following      her surgery.   ALLERGIES:  She has no known medication allergies.   FAMILY HISTORY:  Her father has hypertension.  Her mother, brother, and  sister have borderline diabetes.  Her mother has thyroid dysfunction.   GENETIC HISTORY:  Unremarkable.  The information from the sperm donor notes  no birth defects or genetic diseases in the family.   SOCIAL HISTORY:  The patient is Hispanic.  She is of the [employer].  She has been followed by the physician service [facility].  She denies any alcohol, drug, or tobacco use during this pregnancy.  She has  two years of college.  She is a Haematologist.  Her  husband has two years of  college.  He is a Pensions consultant.   PHYSICAL EXAMINATION:  VITAL SIGNS:  Stable.  The patient is afebrile.  HEENT:  Within normal limits.  LUNGS:  Breath sounds are clear.  HEART:  Regular rate and rhythm without murmur.  BREASTS:  soft and nontender.  ABDOMEN:  Fundal height is approximately 25 cm.  No rebound or guarding is  noted.  Fetal heart rate shows sporadic mild to moderate variable  decelerations usually associated with contractions.  On admission,  contractions were every three minutes, mild.  Fetal fibronectin and GC and  Chlamydia were done.  Wet prep was negative.  PELVIC:  Cervix was noted to be long, closed, and firm.  Group B strep  culture was done.  EXTREMITIES:  Deep tendon reflexes are 2+ without clonus.  There is a trace  edema noted.   LABORATORY DATA:  Ultrasound was done showing a single intrauterine  pregnancy in breech presentation. Amniotic fluid volume, normal placenta,  posterior placenta was noted.  Estimated fetal weight was 753 gm at the 25th  to 50th percentile.  Cervix was 3.3 cm.  Uterine artery doppler was within  normal limits.  Fetal fibronectin was negative.  External monitor tracing  showed contractions every 2 minutes, and intermittent variable decelerations  lasting 90 to 120 secs.   IMPRESSION:  1.  Intrauterine pregnancy at 25-3/7th weeks.  2.  Viral syndrome.  3.  Fetal heart rate decelerations with normal dopplers and no cervical      change.   PLAN:  1.  Admit to birthing suite per consult with Dr. Hal Morales as      attending physician.  2.  Continuous electronic fetal monitoring.  3.  Continue IV fluid.  4.  Re-evaluate urine in the morning.  5.  Possible discharge in the a.m.     Vick   VLL/MEDQ  D:  06/08/2004  T:  06/08/2004  Job:  [id number]

## 2010-11-16 NOTE — H&P (Signed)
[name], [name]                  ACCOUNT NO.:  [id number]   MEDICAL RECORD NO.:  [id number]          PATIENT TYPE:  MAT   LOCATION:  MATC                          [facility]   PHYSICIAN:  Janine Limbo, M.D.DATE OF BIRTH:  1970-09-21   DATE OF ADMISSION:  07/22/2005  DATE OF DISCHARGE:                                HISTORY & PHYSICAL   [name] is a 40 year old gravida 1, para 1 at 3 days status post  endometrial ablation with NovaSure, D&C and hysteroscopy.  She had onset  during the night of increased abdominal pain, nausea and vomiting.  She has  been unable to keep her pain medication down during the night.  She does  report some mild constipation, although she did have a small bowel movement  this morning.  She had been doing well until last night when these symptoms  began.  She denies fever, dysuria or back pain.  She reports normal  appetite.  She had a small amount of dark red bleeding through the night.  Her history is remarkable for:  1) Menometrorrhagia.  2) Previous C section  in March of 2006 for nonreassuring fetal heart rate.  3) History of fibroids  and ovarian cysts.  4) Previous surgeries were a C section in 2006, a LEEP  procedure in 1999, ovarian cystectomy and scar tissue removal in the past.  5) History of infertility.  Her blood type is positive.   Her labs today.  CBC showed a hemoglobin of 13.3, hematocrit of 39.7, white  blood cell count of 17.3 and platelet count of 500.  Clean catch urine  showed specific gravity of 1.025, large amount of hemoglobin, moderate  bilirubin, 15 ketones, greater than 300 total protein, positive nitrites,  moderate leukocyte esterase, few epithelials and no formed elements on  microscopic exam.  Red blood cell count was too numerous to count.  Comprehensive metabolic panel is pending.  The patient had a normal  preoperative examination prior to her D&C, hysteroscopy and NovaSure  ablation.   OBSTETRIC HISTORY:   The patient had one previous cesarean section in [place] of  2006 at term for nonreassuring fetal heart rate without labor.  During that  pregnancy, she did have an elevated one hour glucose challenge, but a normal  three hour GTT and continued to have glycosuria.  She did not have any  proteinuria during her pregnancy.   MEDICAL HISTORY:  She had an abnormal Pap smear in 1999, a colposcopy with a  LEEP procedure at that time.  She has a history of fibroids and ovarian  cyst.  She had surgery to remove the cyst on her right ovary an scar tissue  was removed.  She has a history of infertility and PCOS.  She was on Clomid  in 1998 and in 2005 was seen by Dr. Chevis Pretty for infertility.  She went through  IVF with the [facility] and conceived with donor sperm.   FAMILY HISTORY:  The patient's father has a history of chronic hypertension.  Mother, brother and sister have diabetes.  The  mother has a history of  thyroid disease.  Genetic history is unremarkable.  There are no genetic or  familial disorders.   ALLERGIES:  None.   The patient denies any alcohol, tobacco or drug use.   SOCIAL HISTORY:  [name] is a married, Hispanic female.  Her husband,  [name] is involved and supportive.  They are [employer] in their  faith.  They live with their daughter.   REVIEW OF SYSTEMS:  As above.  Remarkable now for nausea, vomiting and  abdominal pain.   PHYSICAL EXAMINATION:  VITAL SIGNS:  The patient is afebrile.  Other vital  signs are stable.  HEENT:  Within normal limits.  LUNGS:  The breath sounds are clear.  HEART:  Regular rate and rhythm without murmur.  BREASTS:  Soft and nontender.  ABDOMEN:  Generalized tenderness in the lower abdomen.  Right is greater  than left.  This does extend a little bit in the upper abdomen.  There is  mild rebound, but no guarding.  There is positive suprapubic tenderness  noted.  Negative CVA tenderness is noted.  PELVIC:  There is a small  amount of dark red bleeding in the vault.  Negative cervical motion tenderness and no adnexal masses are noted.  EXTREMITIES:  Deep tendon reflexes are 2+ without clonus.  There is trace  edema noted.   Comprehensive metabolic panel is normal, except for a glucose of 118.  SGOT  and SGPT are 24 and 32 respectively.  BUN is 7, creatinine 0.6, potassium  3.8.  Total bilirubin 0.6.   IMPRESSION:  1.  Three days status post D&C, hysteroscopy and NovaSure ablation.  2.  Urinary tract infection.  3.  Nausea, vomiting.  4.  Increased white blood cell count.  5.  Abdominal pain.   PLAN:  1.  IV for hydration.  2.  Phenergan and pain medication.  3.  Per consult with Dr. Stefano Gaul, will plan admission for IV hydration and      antibiotics.  4.  Imaging studies will be undertaken with recommendation for a CT with      contrast.  We will try to have the patient drink contrast.  However, if      this is unsuccessful, the abdominal and pelvic CT will have to be done      without contrast.  5.  MDs will follow.      Renaldo Reel Emilee Hero, C.N.M.      Janine Limbo, M.D.  Electronically Signed    VLL/MEDQ  D:  07/22/2005  T:  07/22/2005  Job:  [id number]

## 2010-11-16 NOTE — Discharge Summary (Signed)
[name], [name]                  ACCOUNT NO.:  [id number]   MEDICAL RECORD NO.:  [id number]          PATIENT TYPE:  INP   LOCATION:  9307                          FACILITY:  [facility]   PHYSICIAN:  Naima A. Dillard, M.D. DATE OF BIRTH:  1970/10/29   DATE OF ADMISSION:  07/22/2005  DATE OF DISCHARGE:  07/25/2005                                 DISCHARGE SUMMARY   ADMISSION DIAGNOSIS:  Three days status post dilation and curettage,  hysteroscopy, NovaSure ablation with abdominal pain and endometritis.   DISCHARGE MEDICATIONS:  Augmentin, Toradol and Ambien.   HOSPITAL COURSE:  The patient was discharged in stable condition. Three days  after her D&C, hysteroscopy, patient presented to the emergency room with  nausea and vomiting and severe abdominal pain. She was afebrile with a  benign exam. No pelvic exam was done but her white count was 17,3 thousand  with a left shift, and urine culture was suspicious for a urinary tract  infection.  She had a CT scan which was found to be normal and was admitted  for observation, IV fluids and Ancef. On hospital day number two the patient  remained afebrile with stable vital signs but did still have some abdominal  pain with cervical motion tenderness and diffuse discomfort. Her urine  culture was still pending but because her exam was suspicious for  endometritis she was changed from Ancef antibiotic to Unasyn. On  postoperative day number five the patient stated she was feeling better but  still had some pain. Her white count actually started to decrease to 13.8  and her abdominal pain was better. She still remained afebrile without any  problems. On July 24, 2005 later in the evening and on January 25 in the  morning, patient said that her pain was well controlled and she remained  afebrile, abdomen was soft and nontender, she had minimal vaginal bleeding  so the patient was discharged in stable condition.   PERTINENT LABORATORY DATA:  As  above. A urine culture was actually negative.  White count did go from 17,000 to 13,000. Gonorrhea/Chlamydia negative.   DISPOSITION:  The patient remained afebrile with stable vital signs and was  discharged home in stable condition. She was discharged home to remain on  pelvic rest and follow up with me in 2 weeks for her regular postoperative  visit.      Naima A. Normand Sloop, M.D.  Electronically Signed     NAD/MEDQ  D:  08/10/2005  T:  08/10/2005  Job:  [id number]

## 2010-11-16 NOTE — H&P (Signed)
[facility]  Patient:    [name], [name] Visit Number: [id number] MRN: [id number]          Service Type: DSU Location: [facility] Attending Physician:  Jaymes Graff A Dictated by:   Pierre Bali Normand Sloop, M.D. 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  The patient stated that she has not had a period for over six to seven months and was interested in fertility.  The patient then had a semen analysis and realized that her husband had no sperm. Subsequently, from that point on, she stated that she did not want to continue with an infertility treatment.  However, before Provera was given, the patient called to complain of heavy periods that were intermittent and irregular.  She was then cycled with Anaprox and birth control pills, and still continued to have dysmenorrhea, and irregular spotting in between.  The patient was also given Provera to help stop the bleeding.  Despite that, that did not work also.  The patient then had an ultrasound which was significant for a 2.6 cm submucosal fibroid in the posterior body.  The rest of her ultrasound was found to be normal.  The patient was then told that she could have Lupron to help shrink the fibroid along with a hysteroscopic resection of the fibroid. The patient then agreed to that medical management so far has failed.  PAST MEDICAL HISTORY:         Is as above.  PAST GYN HISTORY:             The patient has a history of abnormal Pap in the past, but no treatment was done.  Her last Pap smear was normal.  She also has a history of endometriosis and positive ovarian syndrome.  PAST SURGICAL HISTORY:         Significant for a right cystectomy of the ovary, done by laparoscopic in 2000, and in 1996, she had lysis of adhesions done by laparoscopy.  In January of 2000, she said she had a SIS or HSG which showed a right tube that was blocked.  ALLERGIES:                    No known drug allergies.  SOCIAL HISTORY:               She is an occasional alcohol user.  No tobacco or illicit drug use.  PHYSICAL EXAMINATION:  VITAL SIGNS:                  Blood pressure 122/80 and weight 206 pounds.  HEENT:                        Pupils are equal.  Hearing is normal.  Throat is clear.  Thyroid is not enlarged.  HEART:                        Regular rate  and rhythm.  CHEST:                        Clear to auscultation bilaterally.  BREASTS:                      No masses, discharge, skin changes, or nipple retractions.  BACK:                         Had some sunburn, but no CVA tenderness.  ABDOMEN:                      Nontender without any masses or organomegaly.  EXTREMITIES:                  Showed no cyanosis, clubbing, or edema bilaterally.  NEUROLOGIC:                   Exam is within normal limits.  VAGINAL EXAMINATION:          Within normal limits.  Cervix is nontender without any lesions.  Uterus is normal size, shape, and consistency.  Adnexa had no masses.  Rectovaginal exam with no masses.  ASSESSMENT AND PLAN:          The patient has submucosal fibroids, status post Lupron, but states that she is feeling much better.  Has not had any pain or menses.  Her Pap smear was done which is still pending.  Cultures that were done in the office are negative and she does not have a new partner.  She had a laminaria placed without difficulty today.  She is for a dilatation and curettage and hysteroscopy.  She understands the risks of, but not limited to bleeding, infection, perforation of the uterus, and damage to internal organs. Dictated by:   Pierre Bali. Normand Sloop, M.D. Attending  Physician:  Michael Litter DD:  12/21/01 TD:  12/21/01 Job: [id number]

## 2010-11-16 NOTE — Op Note (Signed)
[name], [name]                  ACCOUNT NO.:  [id number]   MEDICAL RECORD NO.:  [id number]          PATIENT TYPE:  AMB   LOCATION:  SDC                           [facility]ILITY:  [facility]   PHYSICIAN:  Naima A. Dillard, M.D. DATE OF BIRTH:  04/08/1971   DATE OF PROCEDURE:  07/19/2005  DATE OF DISCHARGE:                                 OPERATIVE REPORT   PREOPERATIVE DIAGNOSIS:  Menometrorrhagia.   POSTOPERATIVE DIAGNOSIS:  Menometrorrhagia.   PROCEDURES:  1.  D&C hysteroscopy.  2.  NovaSure ablation.   SURGEON:  Dr. Normand Sloop   ANESTHESIA:  MAC and local.   SPECIMENS:  Endometrial curettings.   ESTIMATED BLOOD LOSS:  Minimal.   COMPLICATIONS:  None.  The patient went to PACU in stable condition.   PROCEDURE IN DETAIL:  The patient was taken to the operating room where was  given MAC anesthesia, placed in dorsal lithotomy position, and prepped and  draped in a normal sterile fashion.  A bivalve speculum was placed into the  vagina.  The anterior lip of the cervix was grasped with a single-tooth  tenaculum.  The cervix was infiltrated with 10 mL 1% lidocaine for cervical  block.  The cervical length was measured at 4.5 cm.  The uterine sound was  to 11 cm given a cavity length of 6.5 cm.  The cervix was then dilated with  Pratt dilators to 21.  The hysteroscope was placed into the uterine cavity.  There was some blood seen along the uterine cavity.  You could see the ostia  on the patient's left and finally after washing out some of the blood, you  could see it on the patient's right side, and also there were no polyps or  submucosal fibroids noted.  The hysteroscope was then removed.  The NovaSure  was placed into the uterine cavity and after seating the NovaSure, the  cavity was found to be 4.8.  The power was 172, and the time was 1 minute 28  seconds.  NovaSure was removed without difficulty.  The tenaculum was  removed, and there was some bleeding noted, made hemostatic with  pressure  and silver nitrate.  All instruments were removed from the vagina.  Sponge,  lap, and needle counts were correct x2.  The patient returned to recovery  room in stable condition.  The patient understands that this is not a form  of birth control; however, she should not get pregnant after having this  procedure.     Naima A. Normand Sloop, M.D.  Electronically Signed    NAD/MEDQ  D:  07/19/2005  T:  07/19/2005  Job:  [id number]

## 2010-11-16 NOTE — Op Note (Signed)
[facility]  Patient:    [name], [name] Visit Number: [id number] MRN: [id number]          Service Type: DSU Location: [facility] Attending Physician:  Jaymes Graff A Dictated by:   Leona Carry, M.D. Proc. Date: 12/22/01 Admit Date:  12/22/2001                             Operative Report  PREOPERATIVE DIAGNOSIS:       Submucosal fibroid.  POSTOPERATIVE DIAGNOSIS:      No submucosal fibroid, scant endometrium.  OPERATION:                    D&C, hysteroscopy.  SURGEON:                      Leona Carry, M.D.  ASSISTANT:  ANESTHESIA:                   General laryngeal mask airway.  DEFICIT:                      50 cc 3% Sorbitol.  ESTIMATED BLOOD LOSS:         Minimal.  IV FLUIDS:                    900 cc crystalloid.  FINDINGS:                     Uterus sounded to 8.5 cm.  There were no submucosal fibroids noted.  Both ostia were visualized.  COMPLICATIONS:                None.  The patient went to the recovery room in stable condition.  DESCRIPTION OF PROCEDURE:     Laminaria was placed yesterday as a means to facilitate cervical dilatation today and the patient wanted a laminaria last evening.  The patient was then taken to the operating room where she was given general laryngeal mask airway anesthesia, placed in the dorsal lithotomy position, and prepped and draped in the usual sterile fashion.  On examination, the uterus was top normal size, about 7 to 8 weeks size with normal adnexa bilaterally.  A bivalve speculum was placed into the vagina. The anterior lip of the cervix was grasped with a single tooth tenaculum.  10 cc of 1% lidocaine was then injected as a paracervical block.  The cervix was already found to be about 0.5 cm dilated.  The uterine sound was then placed into the uterine cavity went about 8.5 cm.  The cervix was further dilated to 31 using Pratt dilators.  The resectoscope was then placed into the uterine cavity  and both ostia were visualized.  Both side walls anterior and posterior walls were visualized and there were no submucosal fibroids.  The loupe without cautery was then used to feel against the endometrium both anteriorly and posteriorly just to make sure there were no fibroids, but maybe was more myometrial than endometrial.  There were no abnormalities palpated with the loupe. The resectoscope was then removed and a sharp curettage was placed into the uterine cavity and a sharp curettage was done.  There were no irregularities noted on examination.  Scant endometrial curettings were obtained. All instruments were removed from the uterus and vagina.  The tenaculum was removed from the cervix with good hemostasis noted.  Sponge, needle, and  instrument counts were correct x2.  The patient went to the recovery room in stable condition. Dictated by:   Leona Carry, M.D. Attending Physician:  Michael Litter DD:  12/22/01 TD:  12/22/01 Job: [id number]

## 2010-11-16 NOTE — Op Note (Signed)
[name], [name]                  ACCOUNT NO.:  [id number]   MEDICAL RECORD NO.:  [id number]          PATIENT TYPE:  INP   LOCATION:  9144                          [facility]ACILITY:  [facility]   PHYSICIAN:  Naima A. Dillard, M.D. DATE OF BIRTH:  [birth year]   DATE OF PROCEDURE:  09/19/2004  DATE OF DISCHARGE:                                 OPERATIVE REPORT   PREOPERATIVE DIAGNOSIS:  Intrauterine pregnancy at term with nonreassuring  fetal heart tones.   POSTOPERATIVE DIAGNOSIS:  Intrauterine pregnancy at term with nonreassuring  fetal heart tones.   PROCEDURE:  Primary low transverse cesarean section.   SURGEON:  Naima A. Normand Sloop, M.D.   ASSISTANT:  Rica Koyanagi, C.N.M.   ANESTHESIA:  Spinal.   ESTIMATED BLOOD LOSS:  700 mL.   IV FLUIDS:  2000 mL crystalloid.   URINE OUTPUT:  150 mL clear urine at the end of the procedure.   FINDINGS:  A female infant in footling breech presentation with scant clear  fluid and nuchal cord x1.  Apgars were 8 and 9.  Cord pH of 7.35.  the  patient had a small anterior fibroid about 2 cm on the anterior uterine  wall.  There was a normal-appearing left tube and ovary.  The right tube and  ovary were not visualized but were felt to be normal.  She had normal  abdominal anatomy. The placenta was sent to pathology for evaluation.   PROCEDURE IN DETAIL:  The patient was taken to the operating room, where she  was given spinal anesthesia, placed in dorsal supine position with a left  lateral tilt.  Once the anesthesia was found to be adequate, a Pfannenstiel  incision was then made with a scalpel and carried down to the fascia using  Bovie cautery.  The fascia was incised in the midline and was then extended  bilaterally with Mayo scissors.  Kochers x2 placed in the superior aspect of  the fascia, which was dissected off the rectus muscle both sharply and  bluntly.  The inferior aspect of the fascia was dissected off in a similar  fashion.  The rectus  muscles were separated in the midline.  The peritoneum  was identified and entered sharply with Metzenbaum scissors, extended  superiorly and inferiorly with good visualization of bowel and bladder.  The  vesicouterine peritoneum was identified, lifted up, entered sharply and  extended bilaterally.  The bladder blade was reinserted.  A primary lower  transverse uterine incision was then made with the scalpel and extended  bilaterally in a transverse fashion using bandage scissors.  The infant was  noted to be breech and was delivered using breech maneuvers without  difficulty.  The placenta was manually delivered.  Before the placents was  delivered, the cord was clamped and cut, sent off to the waiting  pediatricians.  Cord blood, cord gas was obtained.  The placenta was  manually delivered.  The uterus was cleared of all clot and debris.  The  findings noted above were seen.  Uterus was repaired with 0 Vicryl in a  running  locked fashion.  A second layer of imbrication was used to close the  lower transverse uterine incision.  Irrigation was then done, and there was  a little area of bleeding in the midline, and this was made hemostatic with  a figure-of-eight suture.  There was a second area of small bleeding to the  right side of the uterus, which was made hemostatic with 0 Vicryl suture.  Once the uterus was noted to be hemostatic, the peritoneum was closed with 2-  0 chromic in a running fashion.  The muscles were inspected and made  hemostatic with Bovie cautery at any bleeding sites.  The fascia was closed  with 0 Vicryl in a running fashion.  The subcutaneous tissue was then made  hemostatic with Bovie cautery.  The subcutaneous tissue was reapproximated  with 2-0 plain.  Before the subcutaneous tissue was reapproximated, a  Jackson-Pratt drain was placed in the subcutaneous tissue.  The skin was  closed with 3-0 Monocryl.  Sponge, lap and needle counts were correct x2.  The  patient went to the recovery room in stable condition.      NAD/MEDQ  D:  09/19/2004  T:  09/20/2004  Job:  [id number]

## 2011-10-03 ENCOUNTER — Other Ambulatory Visit: Payer: Self-pay | Admitting: Obstetrics and Gynecology

## 2011-10-03 DIAGNOSIS — Z1231 Encounter for screening mammogram for malignant neoplasm of breast: Secondary | ICD-10-CM

## 2011-10-22 ENCOUNTER — Ambulatory Visit
Admission: RE | Admit: 2011-10-22 | Discharge: 2011-10-22 | Disposition: A | Discharge status: Home / Self Care | Payer: BC Managed Care – PPO | Source: Ambulatory Visit | Attending: Obstetrics and Gynecology | Admitting: Obstetrics and Gynecology

## 2011-10-22 DIAGNOSIS — Z1231 Encounter for screening mammogram for malignant neoplasm of breast: Secondary | ICD-10-CM

## 2011-12-04 ENCOUNTER — Ambulatory Visit (INDEPENDENT_AMBULATORY_CARE_PROVIDER_SITE_OTHER): Payer: BC Managed Care – PPO | Admitting: Obstetrics and Gynecology

## 2011-12-04 ENCOUNTER — Encounter: Payer: Self-pay | Admitting: Obstetrics and Gynecology

## 2011-12-04 VITALS — BP 112/74 | Ht 67.0 in | Wt 184.0 lb

## 2011-12-04 DIAGNOSIS — Z01419 Encounter for gynecological examination (general) (routine) without abnormal findings: Secondary | ICD-10-CM

## 2011-12-04 DIAGNOSIS — N390 Urinary tract infection, site not specified: Secondary | ICD-10-CM

## 2011-12-04 LAB — POCT URINALYSIS DIPSTICK
Blood, UA: >3
Protein, UA: NEGATIVE
Spec Grav, UA: 1.01
Urobilinogen, UA: NEGATIVE

## 2011-12-04 MED ORDER — NITROFURANTOIN MONOHYD MACRO 100 MG PO CAPS: 100.0000 mg | ORAL_CAPSULE | Freq: Two times a day (BID) | ORAL | Status: AC

## 2011-12-04 NOTE — Progress Notes (Signed)
The patient is not taking hormone replacement therapy The patient  is taking a Calcium supplement. Post-menopausal bleeding:no hyst  Last Pap: was normal November  2009 Last mammogram: was normal April  2013 Last DEXA scan : T= 0    Last colonoscopy:Pt Never Had One   Urinary symptoms: urinary frequency Normal bowel movements: Yes Reports abuse at home: No  Subjective:    [name] is a 41 y.o. female, G1P1001, who presents for an annual exam. S/P robotic hysterectomy.C/O new urgency with mild dysuria x 4 days.U/A positive for Nitrites    History   Social History  . Marital Status: Married    Spouse Name: N/A    Number of Children: N/A  . Years of Education: N/A   Social History Main Topics  . Smoking status: Never Smoker   . Smokeless tobacco: Never Used  . Alcohol Use: No  . Drug Use: No  . Sexually Active: Yes    Birth Control/ Protection: Surgical     hyst   Other Topics Concern  . None   Social History Narrative  . None    Menstrual cycle:   LMP: No LMP recorded. Patient has had a hysterectomy.             The following portions of the patient's history were reviewed and updated as appropriate: allergies, current medications, past family history, past medical history, past social history, past surgical history and problem list.  Review of Systems Pertinent items are noted in HPI. Breast:Negative for breast lump,nipple discharge or nipple retraction Gastrointestinal: Negative for abdominal pain, change in bowel habits or rectal bleeding Urinary:negative   Objective:    BP 112/74  Ht 5\' 7"  (1.702 m)  Wt 184 lb (83.462 kg)  BMI 28.82 kg/m2    Weight:  Wt Readings from Last 1 Encounters:  12/04/11 184 lb (83.462 kg)          BMI: Body mass index is 28.82 kg/(m^2).  General Appearance: Alert, appropriate appearance for age. No acute distress HEENT: Grossly normal Neck / Thyroid: Supple, no masses, nodes or enlargement Lungs: clear to auscultation  bilaterally Back: No CVA tenderness Breast Exam: No masses or nodes.No dimpling, nipple retraction or discharge. Cardiovascular: Regular rate and rhythm. S1, S2, no murmur Gastrointestinal: Soft, non-tender, no masses or organomegaly Pelvic Exam: Vulva and vagina appear normal. Bimanual exam reveals normal  Adnexa.Uterus surgically absent Rectovaginal: normal rectal, no masses Lymphatic Exam: Non-palpable nodes in neck, clavicular, axillary, or inguinal regions Skin: no rash or abnormalities Neurologic: Normal gait and speech, no tremor  Psychiatric: Alert and oriented, appropriate affect.    Urinalysis:nitrites    Assessment:    Normal gyn exam UTI    Plan:    mammogram pap smear return annually or prn Urine to culture Macrobid x 5 days STD screening: declined Contraception:no method      [facility]

## 2013-08-07 ENCOUNTER — Encounter: Payer: Self-pay | Admitting: *Deleted

## 2014-05-02 ENCOUNTER — Encounter: Payer: Self-pay | Admitting: *Deleted

## 2015-06-01 ENCOUNTER — Other Ambulatory Visit: Payer: Self-pay | Admitting: Family Medicine

## 2015-06-01 ENCOUNTER — Ambulatory Visit
Admission: RE | Admit: 2015-06-01 | Discharge: 2015-06-01 | Disposition: A | Discharge status: Home / Self Care | Payer: 59 | Source: Ambulatory Visit | Attending: Family Medicine | Admitting: Family Medicine

## 2015-06-01 DIAGNOSIS — R053 Chronic cough: Secondary | ICD-10-CM

## 2015-06-01 DIAGNOSIS — M25812 Other specified joint disorders, left shoulder: Secondary | ICD-10-CM

## 2015-06-01 DIAGNOSIS — R05 Cough: Secondary | ICD-10-CM

## 2016-08-26 DIAGNOSIS — Z0001 Encounter for general adult medical examination with abnormal findings: Secondary | ICD-10-CM | POA: Diagnosis not present

## 2016-08-26 DIAGNOSIS — E669 Obesity, unspecified: Secondary | ICD-10-CM | POA: Diagnosis not present

## 2016-08-26 DIAGNOSIS — Z23 Encounter for immunization: Secondary | ICD-10-CM | POA: Diagnosis not present

## 2016-08-26 DIAGNOSIS — Z833 Family history of diabetes mellitus: Secondary | ICD-10-CM | POA: Diagnosis not present

## 2016-08-30 DIAGNOSIS — Z6830 Body mass index (BMI) 30.0-30.9, adult: Secondary | ICD-10-CM | POA: Diagnosis not present

## 2016-08-30 DIAGNOSIS — Z1231 Encounter for screening mammogram for malignant neoplasm of breast: Secondary | ICD-10-CM | POA: Diagnosis not present

## 2016-08-30 DIAGNOSIS — Z1272 Encounter for screening for malignant neoplasm of vagina: Secondary | ICD-10-CM | POA: Diagnosis not present

## 2016-08-30 DIAGNOSIS — Z01419 Encounter for gynecological examination (general) (routine) without abnormal findings: Secondary | ICD-10-CM | POA: Diagnosis not present

## 2017-03-18 DIAGNOSIS — Z23 Encounter for immunization: Secondary | ICD-10-CM | POA: Diagnosis not present

## 2017-06-05 DIAGNOSIS — Z713 Dietary counseling and surveillance: Secondary | ICD-10-CM | POA: Diagnosis not present

## 2017-08-01 DIAGNOSIS — Z713 Dietary counseling and surveillance: Secondary | ICD-10-CM | POA: Diagnosis not present

## 2017-10-16 DIAGNOSIS — Z713 Dietary counseling and surveillance: Secondary | ICD-10-CM | POA: Diagnosis not present

## 2017-11-15 DIAGNOSIS — M545 Low back pain: Secondary | ICD-10-CM | POA: Diagnosis not present

## 2018-03-10 DIAGNOSIS — Z6829 Body mass index (BMI) 29.0-29.9, adult: Secondary | ICD-10-CM | POA: Diagnosis not present

## 2018-03-10 DIAGNOSIS — Z01419 Encounter for gynecological examination (general) (routine) without abnormal findings: Secondary | ICD-10-CM | POA: Diagnosis not present

## 2018-03-10 DIAGNOSIS — Z1231 Encounter for screening mammogram for malignant neoplasm of breast: Secondary | ICD-10-CM | POA: Diagnosis not present

## 2018-03-22 DIAGNOSIS — Z23 Encounter for immunization: Secondary | ICD-10-CM | POA: Diagnosis not present

## 2018-06-15 DIAGNOSIS — E559 Vitamin D deficiency, unspecified: Secondary | ICD-10-CM | POA: Diagnosis not present

## 2018-06-15 DIAGNOSIS — R631 Polydipsia: Secondary | ICD-10-CM | POA: Diagnosis not present

## 2018-06-15 DIAGNOSIS — R5383 Other fatigue: Secondary | ICD-10-CM | POA: Diagnosis not present

## 2018-06-15 DIAGNOSIS — R7303 Prediabetes: Secondary | ICD-10-CM | POA: Diagnosis not present

## 2018-07-23 DIAGNOSIS — Z713 Dietary counseling and surveillance: Secondary | ICD-10-CM | POA: Diagnosis not present

## 2018-09-08 DIAGNOSIS — J0110 Acute frontal sinusitis, unspecified: Secondary | ICD-10-CM | POA: Diagnosis not present

## 2018-09-08 DIAGNOSIS — J0100 Acute maxillary sinusitis, unspecified: Secondary | ICD-10-CM | POA: Diagnosis not present

## 2018-09-08 DIAGNOSIS — J029 Acute pharyngitis, unspecified: Secondary | ICD-10-CM | POA: Diagnosis not present

## 2019-01-04 DIAGNOSIS — E119 Type 2 diabetes mellitus without complications: Secondary | ICD-10-CM | POA: Diagnosis not present

## 2019-01-22 DIAGNOSIS — E1169 Type 2 diabetes mellitus with other specified complication: Secondary | ICD-10-CM | POA: Diagnosis not present

## 2019-01-22 DIAGNOSIS — E7800 Pure hypercholesterolemia, unspecified: Secondary | ICD-10-CM | POA: Diagnosis not present

## 2019-01-22 DIAGNOSIS — E559 Vitamin D deficiency, unspecified: Secondary | ICD-10-CM | POA: Diagnosis not present

## 2019-02-19 DIAGNOSIS — E1169 Type 2 diabetes mellitus with other specified complication: Secondary | ICD-10-CM | POA: Diagnosis not present

## 2019-02-19 DIAGNOSIS — Z8249 Family history of ischemic heart disease and other diseases of the circulatory system: Secondary | ICD-10-CM | POA: Diagnosis not present

## 2019-02-19 DIAGNOSIS — E7800 Pure hypercholesterolemia, unspecified: Secondary | ICD-10-CM | POA: Diagnosis not present

## 2020-04-11 ENCOUNTER — Other Ambulatory Visit: Payer: Self-pay | Admitting: Family Medicine

## 2020-04-11 DIAGNOSIS — R519 Headache, unspecified: Secondary | ICD-10-CM

## 2022-06-20 ENCOUNTER — Encounter: Payer: Self-pay | Admitting: Internal Medicine

## 2022-07-11 ENCOUNTER — Ambulatory Visit (AMBULATORY_SURGERY_CENTER): Payer: Self-pay | Admitting: *Deleted

## 2022-07-11 VITALS — Ht 66.0 in | Wt 176.0 lb

## 2022-07-11 DIAGNOSIS — Z1211 Encounter for screening for malignant neoplasm of colon: Secondary | ICD-10-CM

## 2022-07-11 MED ORDER — NA SULFATE-K SULFATE-MG SULF 17.5-3.13-1.6 GM/177ML PO SOLN: 1.0000 | Freq: Once | ORAL | 0 refills | Status: AC

## 2022-07-11 NOTE — Progress Notes (Signed)
No egg or soy allergy known to patient  No issues known to pt with past sedation with any surgeries or procedures Patient denies ever being told they had issues or difficulty with intubation  No FH of Malignant Hyperthermia Pt is not on diet pills Pt is not on  home 02  Pt is not on blood thinners  Pt denies issues with constipation  Pt is not on dialysis Pt denies any upcoming cardiac testing Pt encouraged to use to use Singlecare or Goodrx to reduce cost  Patient's chart reviewed by Osvaldo Angst CNRA prior to previsit and patient appropriate for the [place].  Previsit completed and red dot placed by patient's name on their procedure day (on provider's schedule).  . Visit by mail Instructions sent by mail

## 2022-08-05 ENCOUNTER — Encounter: Payer: Self-pay | Admitting: Internal Medicine

## 2022-08-07 ENCOUNTER — Ambulatory Visit (AMBULATORY_SURGERY_CENTER): Payer: 59 | Admitting: Internal Medicine

## 2022-08-07 ENCOUNTER — Encounter: Payer: Self-pay | Admitting: Internal Medicine

## 2022-08-07 VITALS — BP 107/56 | HR 60 | Temp 97.5°F | Resp 19 | Ht 66.0 in | Wt 176.0 lb

## 2022-08-07 DIAGNOSIS — D123 Benign neoplasm of transverse colon: Secondary | ICD-10-CM | POA: Diagnosis not present

## 2022-08-07 DIAGNOSIS — D122 Benign neoplasm of ascending colon: Secondary | ICD-10-CM

## 2022-08-07 DIAGNOSIS — Z1211 Encounter for screening for malignant neoplasm of colon: Secondary | ICD-10-CM | POA: Diagnosis present

## 2022-08-07 DIAGNOSIS — K635 Polyp of colon: Secondary | ICD-10-CM

## 2022-08-07 MED ORDER — SODIUM CHLORIDE 0.9 % IV SOLN: 500.0000 mL | Freq: Once | INTRAVENOUS | Status: DC

## 2022-08-07 NOTE — Patient Instructions (Addendum)
Handout on polyps and hemorrhoids given to patient. Await pathology results. Resume previous diet and continue present medications. Repeat colonoscopy for surveillance in 5 years.   YOU HAD AN ENDOSCOPIC PROCEDURE TODAY AT [facility]:   Refer to the procedure report that was given to you for any specific questions about what was found during the examination.  If the procedure report does not answer your questions, please call your gastroenterologist to clarify.  If you requested that your care partner not be given the details of your procedure findings, then the procedure report has been included in a sealed envelope for you to review at your convenience later.  YOU SHOULD EXPECT: Some feelings of bloating in the abdomen. Passage of more gas than usual.  Walking can help get rid of the air that was put into your GI tract during the procedure and reduce the bloating. If you had a lower endoscopy (such as a colonoscopy or flexible sigmoidoscopy) you may notice spotting of blood in your stool or on the toilet paper. If you underwent a bowel prep for your procedure, you may not have a normal bowel movement for a few days.  Please Note:  You might notice some irritation and congestion in your nose or some drainage.  This is from the oxygen used during your procedure.  There is no need for concern and it should clear up in a day or so.  SYMPTOMS TO REPORT IMMEDIATELY:  Following lower endoscopy (colonoscopy or flexible sigmoidoscopy):  Excessive amounts of blood in the stool  Significant tenderness or worsening of abdominal pains  Swelling of the abdomen that is new, acute  Fever of 100F or higher  For urgent or emergent issues, a gastroenterologist can be reached at any hour by calling [phone]. Do not use MyChart messaging for urgent concerns.    DIET:  We do recommend a small meal at first, but then you may proceed to your regular diet.  Drink plenty of fluids but you  should avoid alcoholic beverages for 24 hours.  ACTIVITY:  You should plan to take it easy for the rest of today and you should NOT DRIVE or use heavy machinery until tomorrow (because of the sedation medicines used during the test).    FOLLOW UP: Our staff will call the number listed on your records the next business day following your procedure.  We will call around 7:15- 8:00 am to check on you and address any questions or concerns that you may have regarding the information given to you following your procedure. If we do not reach you, we will leave a message.     If any biopsies were taken you will be contacted by phone or by letter within the next 1-3 weeks.  Please call us at [phone] if you have not heard about the biopsies in 3 weeks.    SIGNATURES/CONFIDENTIALITY: You and/or your care partner have signed paperwork which will be entered into your electronic medical record.  These signatures attest to the fact that that the information above on your After Visit Summary has been reviewed and is understood.  Full responsibility of the confidentiality of this discharge information lies with you and/or your care-partner.

## 2022-08-07 NOTE — Op Note (Signed)
[facility] Patient Name: [name] Procedure Date: 08/07/2022 7:28 AM MRN: [id number] Endoscopist: Docia Chuck. Henrene Pastor , MD, [id number] Age: 52 Referring MD:  Date of Birth: 1971/03/16 Gender: Female Account #: [id number] Procedure:                Colonoscopy with cold snare polypectomy x 2; biopsy                            polypectomy x 1 Indications:              Screening for colorectal malignant neoplasm Medicines:                Monitored Anesthesia Care Procedure:                Pre-Anesthesia Assessment:                           - Prior to the procedure, a History and Physical                            was performed, and patient medications and                            allergies were reviewed. The patient's tolerance of                            previous anesthesia was also reviewed. The risks                            and benefits of the procedure and the sedation                            options and risks were discussed with the patient.                            All questions were answered, and informed consent                            was obtained. Prior Anticoagulants: The patient has                            taken no anticoagulant or antiplatelet agents. ASA                            Grade Assessment: II - A patient with mild systemic                            disease. After reviewing the risks and benefits,                            the patient was deemed in satisfactory condition to                            undergo the procedure.  After obtaining informed consent, the colonoscope                            was passed under direct vision. Throughout the                            procedure, the patient's blood pressure, pulse, and                            oxygen saturations were monitored continuously. The                            CF [id number] #[id number] was introduced through the anus                            and  advanced to the the cecum, identified by                            appendiceal orifice and ileocecal valve. The                            ileocecal valve, appendiceal orifice, and rectum                            were photographed. The quality of the bowel                            preparation was excellent. The colonoscopy was                            performed without difficulty. The patient tolerated                            the procedure well. The bowel preparation used was                            SUPREP via split dose instruction. Scope In: 8:34:40 AM Scope Out: 8:49:02 AM Scope Withdrawal Time: 0 hours 11 minutes 30 seconds  Total Procedure Duration: 0 hours 14 minutes 22 seconds  Findings:                 Two polyps were found in the transverse colon and                            ascending colon. The polyps were 3 to 4 mm in size.                            These polyps were removed with a cold snare.                            Resection and retrieval were complete.                           A 1 mm polyp  was found in the ascending colon. The                            polyp was removed with a jumbo cold forceps.                            Resection and retrieval were complete.                           Internal hemorrhoids were found during                            retroflexion. The hemorrhoids were moderate.                           The exam was otherwise without abnormality on                            direct and retroflexion views. Complications:            No immediate complications. Estimated blood loss:                            None. Estimated Blood Loss:     Estimated blood loss: none. Impression:               - Two 3 to 4 mm polyps in the transverse colon and                            in the ascending colon, removed with a cold snare.                            Resected and retrieved.                           - One 1 mm polyp in the ascending colon,  removed                            with a jumbo cold forceps. Resected and retrieved.                           - Internal hemorrhoids.                           - The examination was otherwise normal on direct                            and retroflexion views. Recommendation:           - Repeat colonoscopy in 5 years for surveillance.                           - Patient has a contact number available for                            emergencies. The signs and  symptoms of potential                            delayed complications were discussed with the                            patient. Return to normal activities tomorrow.                            Written discharge instructions were provided to the                            patient.                           - Resume previous diet.                           - Continue present medications.                           - Await pathology results. Docia Chuck. Henrene Pastor, MD 08/07/2022 9:08:09 AM This report has been signed electronically.

## 2022-08-07 NOTE — Progress Notes (Signed)
Vss nad trans to pacu °

## 2022-08-07 NOTE — Progress Notes (Signed)
Pt's states no medical or surgical changes since previsit or office visit. 

## 2022-08-07 NOTE — Progress Notes (Signed)
Called to room to assist during endoscopic procedure.  Patient ID and intended procedure confirmed with present staff. Received instructions for my participation in the procedure from the performing physician.  

## 2022-08-07 NOTE — Progress Notes (Signed)
HISTORY OF PRESENT ILLNESS:  [name] is a 52 y.o. female presents for index screening colonoscopy  REVIEW OF SYSTEMS:  All non-GI ROS negative except for  Past Medical History:  Diagnosis Date   Abnormal Pap smear 07/01/1997   Adenomyosis 10/14/2008   Anemia    Hemoglobin as low as 7.9   Candidiasis    Diabetes mellitus without complication (HCC)    DUB (dysfunctional uterine bleeding) 10/14/2008   FH: thyroid disease    Mother   Fibroid 10/14/2008   H/O amenorrhea 07/01/2002   H/O dysmenorrhea 07/01/2001   H/O metrorrhagia 07/01/2000   H/O: menorrhagia 07/02/1999   History of PCOS    Hx of endometriosis    Hx: UTI (urinary tract infection)    Hyperlipidemia    Infertility, female    Irregular periods/menstrual cycles 07/01/2004   Mastalgia in female 07/01/2005   Right breast   Pelvic pain 07/01/2001   PONV (postoperative nausea and vomiting)    SUI (stress urinary incontinence, female)     Past Surgical History:  Procedure Laterality Date   CYSTECTOMY  1995   Right ovary   DILATION AND CURETTAGE OF UTERUS     x 3   HYSTEROSCOPY WITH D & C  2003   LEEP  1999   [place] ABLATION  2007   ROBOTIC ASSISTED LAP VAGINAL HYSTERECTOMY     WISDOM TOOTH EXTRACTION      Social History [name]  reports that she has never smoked. She has never used smokeless tobacco. She reports that she does not drink alcohol and does not use drugs.  family history includes Diabetes in her brother, mother, and sister; Hypertension in her father; Thyroid disease in her mother and sister.  No Known Allergies     PHYSICAL EXAMINATION: Vital signs: BP (!) 101/46   Pulse 66   Temp (!) 97.5 F (36.4 C)   Resp 13   Ht '5\' 6"'$  (1.676 m)   Wt 176 lb (79.8 kg)   SpO2 98%   BMI 28.41 kg/m  General: Well-developed, well-nourished, no acute distress HEENT: Sclerae are anicteric, conjunctiva pink. Oral mucosa intact Lungs: Clear Heart: Regular Abdomen: soft, nontender,  nondistended, no obvious ascites, no peritoneal signs, normal bowel sounds. No organomegaly. Extremities: No edema Psychiatric: alert and oriented x3. Cooperative     ASSESSMENT:   Colon cancer screening  PLAN:  Screening colonoscopy

## 2022-08-08 ENCOUNTER — Telehealth: Payer: Self-pay | Admitting: *Deleted

## 2022-08-08 NOTE — Telephone Encounter (Signed)
  Follow up Call-     08/07/2022    7:16 AM  Call back number  Post procedure Call Back phone  # [phone]  Permission to leave phone message Yes     Patient questions:  Do you have a fever, pain , or abdominal swelling? No. Pain Score  0 *  Have you tolerated food without any problems? Yes.    Have you been able to return to your normal activities? Yes.    Do you have any questions about your discharge instructions: Diet   No. Medications  No. Follow up visit  No.  Do you have questions or concerns about your Care? Yes.    Actions: * If pain score is 4 or above: No action needed, pain <4.

## 2022-08-10 ENCOUNTER — Encounter: Payer: Self-pay | Admitting: Internal Medicine

## 2023-06-11 DIAGNOSIS — E663 Overweight: Secondary | ICD-10-CM | POA: Diagnosis not present

## 2023-06-11 DIAGNOSIS — E559 Vitamin D deficiency, unspecified: Secondary | ICD-10-CM | POA: Diagnosis not present

## 2023-06-11 DIAGNOSIS — R232 Flushing: Secondary | ICD-10-CM | POA: Diagnosis not present

## 2023-06-11 DIAGNOSIS — E119 Type 2 diabetes mellitus without complications: Secondary | ICD-10-CM | POA: Diagnosis not present

## 2023-06-11 DIAGNOSIS — E7800 Pure hypercholesterolemia, unspecified: Secondary | ICD-10-CM | POA: Diagnosis not present

## 2023-11-28 ENCOUNTER — Other Ambulatory Visit: Payer: Self-pay | Admitting: Chiropractic Medicine

## 2023-11-28 ENCOUNTER — Ambulatory Visit
Admission: RE | Admit: 2023-11-28 | Discharge: 2023-11-28 | Disposition: A | Discharge status: Home / Self Care | Source: Ambulatory Visit | Attending: Chiropractic Medicine | Admitting: Chiropractic Medicine

## 2023-11-28 DIAGNOSIS — M25552 Pain in left hip: Secondary | ICD-10-CM
